# Patient Record
Sex: Male | Born: 1960 | Race: Black or African American | Hispanic: No | Marital: Married | Smoking: Former smoker
Health system: Southern US, Community
[De-identification: ages and names within clinical notes are randomized; demographics above are authoritative.]

## PROBLEM LIST (undated history)

## (undated) DIAGNOSIS — I10 Essential (primary) hypertension: Secondary | ICD-10-CM

## (undated) DIAGNOSIS — K219 Gastro-esophageal reflux disease without esophagitis: Secondary | ICD-10-CM

## (undated) DIAGNOSIS — E785 Hyperlipidemia, unspecified: Secondary | ICD-10-CM

## (undated) HISTORY — DX: Gastro-esophageal reflux disease without esophagitis: K21.9

## (undated) HISTORY — PX: OTHER SURGICAL HISTORY: SHX169

## (undated) HISTORY — DX: Hyperlipidemia, unspecified: E78.5

## (undated) HISTORY — PX: FOREIGN BODY REMOVAL: SHX962

---

## 2002-03-11 ENCOUNTER — Emergency Department (HOSPITAL_COMMUNITY): Admission: EM | Admit: 2002-03-11 | Discharge: 2002-03-11 | Payer: Self-pay | Admitting: Emergency Medicine

## 2002-03-22 ENCOUNTER — Encounter: Admission: RE | Admit: 2002-03-22 | Discharge: 2002-03-22 | Payer: Self-pay | Admitting: Internal Medicine

## 2002-04-12 ENCOUNTER — Encounter: Admission: RE | Admit: 2002-04-12 | Discharge: 2002-04-12 | Payer: Self-pay | Admitting: Internal Medicine

## 2002-04-15 ENCOUNTER — Encounter: Admission: RE | Admit: 2002-04-15 | Discharge: 2002-04-15 | Payer: Self-pay | Admitting: Internal Medicine

## 2002-04-18 ENCOUNTER — Encounter: Admission: RE | Admit: 2002-04-18 | Discharge: 2002-04-18 | Payer: Self-pay | Admitting: Internal Medicine

## 2002-09-02 ENCOUNTER — Encounter: Payer: Self-pay | Admitting: Emergency Medicine

## 2002-09-02 ENCOUNTER — Emergency Department (HOSPITAL_COMMUNITY): Admission: EM | Admit: 2002-09-02 | Discharge: 2002-09-02 | Payer: Self-pay | Admitting: Emergency Medicine

## 2002-10-23 ENCOUNTER — Emergency Department (HOSPITAL_COMMUNITY): Admission: EM | Admit: 2002-10-23 | Discharge: 2002-10-23 | Payer: Self-pay | Admitting: Emergency Medicine

## 2005-08-31 ENCOUNTER — Emergency Department (HOSPITAL_COMMUNITY): Admission: EM | Admit: 2005-08-31 | Discharge: 2005-08-31 | Payer: Self-pay | Admitting: Emergency Medicine

## 2005-09-01 ENCOUNTER — Emergency Department (HOSPITAL_COMMUNITY): Admission: EM | Admit: 2005-09-01 | Discharge: 2005-09-01 | Payer: Self-pay | Admitting: Emergency Medicine

## 2009-11-06 ENCOUNTER — Emergency Department (HOSPITAL_COMMUNITY): Admission: EM | Admit: 2009-11-06 | Discharge: 2009-11-07 | Payer: Self-pay | Admitting: Emergency Medicine

## 2010-05-15 ENCOUNTER — Emergency Department (HOSPITAL_COMMUNITY): Admission: EM | Admit: 2010-05-15 | Discharge: 2010-05-15 | Payer: Self-pay | Admitting: Emergency Medicine

## 2010-11-11 LAB — POCT I-STAT, CHEM 8
BUN: 10 mg/dL (ref 6–23)
Calcium, Ion: 1.1 mmol/L — ABNORMAL LOW (ref 1.12–1.32)
Chloride: 104 mEq/L (ref 96–112)
Creatinine, Ser: 0.9 mg/dL (ref 0.4–1.5)
Glucose, Bld: 97 mg/dL (ref 70–99)
HCT: 53 % — ABNORMAL HIGH (ref 39.0–52.0)
Hemoglobin: 18 g/dL — ABNORMAL HIGH (ref 13.0–17.0)
Potassium: 3.9 mEq/L (ref 3.5–5.1)
Sodium: 140 mEq/L (ref 135–145)
TCO2: 26 mmol/L (ref 0–100)

## 2010-11-21 LAB — POCT CARDIAC MARKERS
CKMB, poc: 8.6 ng/mL (ref 1.0–8.0)
Myoglobin, poc: 128 ng/mL (ref 12–200)
Troponin i, poc: <0.05 ng/mL (ref 0.00–0.09)

## 2010-11-21 LAB — POCT I-STAT, CHEM 8
BUN: 13 mg/dL (ref 6–23)
Calcium, Ion: 1.11 mmol/L — ABNORMAL LOW (ref 1.12–1.32)
Chloride: 103 meq/L (ref 96–112)
Creatinine, Ser: 1 mg/dL (ref 0.4–1.5)
Glucose, Bld: 81 mg/dL (ref 70–99)
HCT: 50 % (ref 39.0–52.0)
Hemoglobin: 17 g/dL (ref 13.0–17.0)
Potassium: 4 meq/L (ref 3.5–5.1)
Sodium: 138 mEq/L (ref 135–145)
TCO2: 28 mmol/L (ref 0–100)

## 2010-11-21 LAB — CK TOTAL AND CKMB (NOT AT ARMC)
CK, MB: 12.1 ng/mL (ref 0.3–4.0)
Relative Index: 1.7 (ref 0.0–2.5)
Total CK: 692 U/L — ABNORMAL HIGH (ref 7–232)

## 2010-11-21 LAB — TROPONIN I: Troponin I: 0.01 ng/mL (ref 0.00–0.06)

## 2012-09-13 ENCOUNTER — Encounter (HOSPITAL_COMMUNITY): Payer: Self-pay | Admitting: *Deleted

## 2012-09-13 ENCOUNTER — Emergency Department (HOSPITAL_COMMUNITY)
Admission: EM | Admit: 2012-09-13 | Discharge: 2012-09-13 | Disposition: A | Discharge status: Home / Self Care | Payer: BC Managed Care – PPO | Source: Home / Self Care | Attending: Emergency Medicine | Admitting: Emergency Medicine

## 2012-09-13 DIAGNOSIS — I10 Essential (primary) hypertension: Secondary | ICD-10-CM

## 2012-09-13 DIAGNOSIS — K2990 Gastroduodenitis, unspecified, without bleeding: Secondary | ICD-10-CM

## 2012-09-13 DIAGNOSIS — J40 Bronchitis, not specified as acute or chronic: Secondary | ICD-10-CM

## 2012-09-13 DIAGNOSIS — K2970 Gastritis, unspecified, without bleeding: Secondary | ICD-10-CM

## 2012-09-13 HISTORY — DX: Essential (primary) hypertension: I10

## 2012-09-13 MED ORDER — AMOXICILLIN 500 MG PO CAPS: 500.0000 mg | ORAL_CAPSULE | Freq: Three times a day (TID) | ORAL | Status: AC

## 2012-09-13 MED ORDER — ONDANSETRON HCL 4 MG PO TABS: 4.0000 mg | ORAL_TABLET | Freq: Three times a day (TID) | ORAL | Status: DC | PRN

## 2012-09-13 MED ORDER — OMEPRAZOLE 20 MG PO CPDR: 20.0000 mg | DELAYED_RELEASE_CAPSULE | Freq: Every day | ORAL | Status: DC

## 2012-09-13 MED ORDER — LISINOPRIL 10 MG PO TABS: 20.0000 mg | ORAL_TABLET | Freq: Every day | ORAL | Status: DC

## 2012-09-13 NOTE — ED Provider Notes (Signed)
History     CSN: [id number]  Arrival date & time 09/13/12  1708   First MD Initiated Contact with Patient 09/13/12 1711      Chief Complaint  Patient presents with  . Emesis    (Consider location/radiation/quality/duration/timing/severity/associated sxs/prior treatment) HPI Comments: Patient presents urgent care this evening described that he's been having a respiratory cold-like infection for about 2 weeks. He's been coughing and now is observing colored phlegm. He feels he still has congestion in his chest. Has not had a fever for about a week but doesn't remember that during the first week he had some tactile fevers at home. Having bodyaches, vomited once here as a result of a coughing spell. Is at some soreness on his chest from coughing.     "I have run out of my blood pressure medicine for many months" as I don't have primary care Dr. Patient denies any chest pains, dizziness, weakness or numbness of any upper or lower extremity, when asked.  Patient is a 52 y.o. male presenting with vomiting. The history is provided by the patient.  Emesis  This is a recurrent problem. The current episode started more than 1 week ago. The problem occurs 5 to 10 times per day. The problem has been gradually worsening. The emesis has an appearance of stomach contents. Associated symptoms include chills, cough and URI. Pertinent negatives include no arthralgias, no diarrhea, no fever, no headaches and no myalgias. Risk factors include ill contacts.    Past Medical History  Diagnosis Date  . Hypertension     off medication for 1 year    History reviewed. No pertinent past surgical history.  Family History  Problem Relation Age of Onset  . Stroke Mother   . Stroke Father   . Hypertension Father     History  Substance Use Topics  . Smoking status: Current Every Day Smoker -- 0.2 packs/day    Types: Cigarettes  . Smokeless tobacco: Not on file  . Alcohol Use: 8.4 oz/week    14 Cans of  beer per week      Review of Systems  Constitutional: Positive for chills, activity change and appetite change. Negative for fever and fatigue.  HENT: Positive for congestion, sore throat, rhinorrhea and sinus pressure. Negative for trouble swallowing, neck pain, neck stiffness and voice change.   Respiratory: Positive for cough. Negative for chest tightness, shortness of breath and wheezing.   Cardiovascular: Negative for chest pain, palpitations and leg swelling.  Gastrointestinal: Positive for vomiting. Negative for diarrhea.  Musculoskeletal: Negative for myalgias and arthralgias.  Skin: Negative for rash.  Neurological: Negative for dizziness, weakness, numbness and headaches.    Allergies  Review of patient's allergies indicates no known allergies.  Home Medications   Current Outpatient Rx  Name  Route  Sig  Dispense  Refill  . GUAIFENESIN 100 MG/5ML PO SYRP   Oral   Take 200 mg by mouth 3 (three) times daily as needed.         . IBUPROFEN 200 MG PO TABS   Oral   Take 600 mg by mouth every 6 (six) hours as needed.           BP 163/108  Pulse 84  Temp 98.9 F (37.2 C) (Oral)  Resp 18  SpO2 97%  Physical Exam  Nursing note and vitals reviewed. Constitutional: Vital signs are normal. He appears well-developed and well-nourished.  Non-toxic appearance. He does not have a sickly appearance. He does not appear  ill. No distress.  HENT:  Head: Normocephalic.  Eyes: Conjunctivae normal are normal. Right eye exhibits no discharge. Left eye exhibits no discharge. No scleral icterus.  Neck: Neck supple. No JVD present.  Cardiovascular: Normal rate.  Exam reveals no gallop and no friction rub.   No murmur heard. Pulmonary/Chest: Effort normal and breath sounds normal. No respiratory distress.  Abdominal: Normal appearance. There is no hepatosplenomegaly or hepatomegaly. There is tenderness in the epigastric area. There is no rigidity, no rebound, no guarding, no CVA  tenderness, no tenderness at McBurney's point and negative Murphy's sign.    Musculoskeletal: He exhibits no edema and no tenderness.  Lymphadenopathy:    He has no cervical adenopathy.  Neurological: He is alert.  Skin: No rash noted. No erythema.    ED Course  Procedures (including critical care time)  Labs Reviewed - No data to display No results found.   No diagnosis found.    MDM  Patient with symptoms and exam consistent with bronchitis has been expressing respiratory symptoms for at least 2 weeks. Is in no respiratory distress. Nauseous - and one episode of emesis after a coughing spell. Patient is also noted to be hypertensive ( no chest pains or neurological symptoms ).  Problem #1 respiratory symptoms symptoms and exam consistent with bronchitis patient has been symptomatic for 2 weeks based on lung exam and pulse oxygenation the ranges from 97-100% on room air will treat patient with doxycycline and short course of prednisone has been instructed to followup with no improvement is noted after 3-5 days.    problem #2 patient is possibly having gastritis and he suspect is related to combination of the cough syrup with Motrin. Will start patient on a PPI prescribe him some Zofran instructed patient that if any blood any further vomiting or with any bowel movement she should go to the emergency department. He understands discharge instructions and treatment plan  Problem #3 chronic hypertension untreated and uncontrolled. Will start patient on hydrochlorothiazide information was given to check her eligibility to be established in the adult care center.      Jimmie Molly, MD 09/13/12 [phone]

## 2012-09-13 NOTE — ED Notes (Addendum)
C/o cough and cold 09/01/12.  He got better and got sick again yesterday with flu symptoms, body aches, chills and does not know if he had a fever- no thermometer.  Vomited x 1 approx.100 cc dark pinkish liquid ( he states is the Robitussin) today on arrival to the room.  Cough never completely went away.  Sputum yellow.  He is a smoker. C/o nausea.

## 2012-09-30 ENCOUNTER — Encounter (HOSPITAL_COMMUNITY): Payer: Self-pay

## 2012-09-30 ENCOUNTER — Emergency Department (INDEPENDENT_AMBULATORY_CARE_PROVIDER_SITE_OTHER): Payer: BC Managed Care – PPO

## 2012-09-30 ENCOUNTER — Emergency Department (HOSPITAL_COMMUNITY)
Admission: EM | Admit: 2012-09-30 | Discharge: 2012-09-30 | Disposition: A | Discharge status: Home / Self Care | Payer: BC Managed Care – PPO | Source: Home / Self Care

## 2012-09-30 DIAGNOSIS — J40 Bronchitis, not specified as acute or chronic: Secondary | ICD-10-CM

## 2012-09-30 MED ORDER — PREDNISONE 10 MG PO TABS: ORAL_TABLET | ORAL | Status: DC

## 2012-09-30 MED ORDER — ALBUTEROL SULFATE HFA 108 (90 BASE) MCG/ACT IN AERS: 1.0000 | INHALATION_SPRAY | Freq: Four times a day (QID) | RESPIRATORY_TRACT | Status: DC | PRN

## 2012-09-30 NOTE — ED Notes (Signed)
C/o "lightheadedness" , some fever and cough. Was seen 09/13/2012 still not better.

## 2012-09-30 NOTE — ED Provider Notes (Signed)
History     CSN: [id number]  Arrival date & time 09/30/12  1344   None     Chief Complaint  Patient presents with  . Dizziness    (Consider location/radiation/quality/duration/timing/severity/associated sxs/prior treatment) Patient is a 52 y.o. male presenting with cough. The history is provided by the patient. No language interpreter was used.  Cough This is a new problem. The current episode started more than 1 week ago. The problem occurs constantly. The cough is productive of sputum. There has been no fever. The fever has been present for less than 1 day. Associated symptoms include rhinorrhea and shortness of breath. He has tried nothing for the symptoms. The treatment provided moderate relief. He is a smoker. His past medical history is significant for bronchitis. His past medical history does not include pneumonia.   Pt complains of a cough and congestion.  Pt was treated 2 weeks ago.  Symptoms continue Past Medical History  Diagnosis Date  . Hypertension     off medication for 1 year    History reviewed. No pertinent past surgical history.  Family History  Problem Relation Age of Onset  . Stroke Mother   . Stroke Father   . Hypertension Father     History  Substance Use Topics  . Smoking status: Current Every Day Smoker -- 0.2 packs/day    Types: Cigarettes  . Smokeless tobacco: Not on file  . Alcohol Use: 8.4 oz/week    14 Cans of beer per week      Review of Systems  HENT: Positive for rhinorrhea.   Respiratory: Positive for cough and shortness of breath.   All other systems reviewed and are negative.    Allergies  Review of patient's allergies indicates no known allergies.  Home Medications   Current Outpatient Rx  Name  Route  Sig  Dispense  Refill  . LISINOPRIL 10 MG PO TABS   Oral   Take 2 tablets (20 mg total) by mouth daily.   30 tablet   1   . ONDANSETRON HCL 4 MG PO TABS   Oral   Take 1 tablet (4 mg total) by mouth every 8 (eight)  hours as needed for nausea.   10 tablet   0   . OMEPRAZOLE 20 MG PO CPDR   Oral   Take 1 capsule (20 mg total) by mouth daily.   15 capsule   0     BP 180/129  Pulse 86  Temp 99.2 F (37.3 C) (Oral)  Resp 18  SpO2 100%  Physical Exam  Nursing note and vitals reviewed. Constitutional: He is oriented to person, place, and time. He appears well-developed and well-nourished.  HENT:  Head: Normocephalic.  Right Ear: External ear normal.  Left Ear: External ear normal.  Nose: Nose normal.  Mouth/Throat: Oropharynx is clear and moist.  Eyes: Conjunctivae normal and EOM are normal. Pupils are equal, round, and reactive to light.  Neck: Normal range of motion. Neck supple.  Cardiovascular: Normal rate.   Pulmonary/Chest: Effort normal and breath sounds normal.  Abdominal: Soft. Bowel sounds are normal.  Musculoskeletal: Normal range of motion.  Neurological: He is alert and oriented to person, place, and time. He has normal reflexes.  Skin: Skin is warm.  Psychiatric: He has a normal mood and affect.    ED Course  Procedures (including critical care time)  Labs Reviewed - No data to display No results found.   No diagnosis found.    MDM  No pneumonia.  I will treat with albuterol and prednisone.        Lonia Skinner [place], [place] 09/30/12 1704

## 2012-09-30 NOTE — ED Notes (Signed)
[id number]<AV> Expected date:10/01/12<BR> Expected time: 8:00 PM<BR> Means of arrival:<BR> Comments:<BR> Computer inoperative

## 2012-09-30 NOTE — ED Provider Notes (Signed)
Medical screening examination/treatment/procedure(s) were performed by non-physician practitioner and as supervising physician I was immediately available for consultation/collaboration.  Raynald Blend, MD 09/30/12 [phone]

## 2012-10-01 ENCOUNTER — Encounter (HOSPITAL_COMMUNITY): Payer: Self-pay

## 2012-10-01 ENCOUNTER — Emergency Department (INDEPENDENT_AMBULATORY_CARE_PROVIDER_SITE_OTHER)
Admission: EM | Admit: 2012-10-01 | Discharge: 2012-10-01 | Disposition: A | Discharge status: Home / Self Care | Payer: BC Managed Care – PPO | Source: Home / Self Care

## 2012-10-01 DIAGNOSIS — R0982 Postnasal drip: Secondary | ICD-10-CM

## 2012-10-01 DIAGNOSIS — J040 Acute laryngitis: Secondary | ICD-10-CM

## 2012-10-01 DIAGNOSIS — J069 Acute upper respiratory infection, unspecified: Secondary | ICD-10-CM

## 2012-10-01 MED ORDER — ALBUTEROL SULFATE HFA 108 (90 BASE) MCG/ACT IN AERS: 1.0000 | INHALATION_SPRAY | Freq: Four times a day (QID) | RESPIRATORY_TRACT | Status: DC | PRN

## 2012-10-01 MED ORDER — PREDNISONE 10 MG PO TABS: ORAL_TABLET | ORAL | Status: DC

## 2012-10-01 MED ORDER — PHENYLEPHRINE-CHLORPHEN-DM 10-4-12.5 MG/5ML PO LIQD: 5.0000 mL | ORAL | Status: DC | PRN

## 2012-10-01 NOTE — ED Provider Notes (Signed)
Medical screening examination/treatment/procedure(s) were performed by non-physician practitioner and as supervising physician I was immediately available for consultation/collaboration.  Artie Takayama, M.D.   Jaxon Mynhier C Laquenta Whitsell, MD 10/01/12 2230 

## 2012-10-01 NOTE — ED Provider Notes (Signed)
History     CSN: [id number]  Arrival date & time 10/01/12  1012   First MD Initiated Contact with Patient 10/01/12 1015      Chief Complaint  Patient presents with  . Cough    (Consider location/radiation/quality/duration/timing/severity/associated sxs/prior treatment) HPI Comments: 52 year old male who presents with soreness in the throat and laryngitis particularly in the morning upon awakening. This started several days ago. He states he still has some chills and sweating at night. This morning when he woke he had  mild dizziness which has resolved. He was seen yesterday for similar symptoms with a diagnosis of bronchitis and treated with prednisone and albuterol inhaler. He has started taking those. He states that his bronchitis feels like it is getting much better. His greatest concern is the laryngitis and soreness of the throat. He is not taking any medications for these symptoms. He states that the provider told him yesterday she went to give him something for his throat he states that she may have forgotten.   Past Medical History  Diagnosis Date  . Hypertension     off medication for 1 year    History reviewed. No pertinent past surgical history.  Family History  Problem Relation Age of Onset  . Stroke Mother   . Stroke Father   . Hypertension Father     History  Substance Use Topics  . Smoking status: Current Every Day Smoker -- 0.2 packs/day    Types: Cigarettes  . Smokeless tobacco: Not on file  . Alcohol Use: 8.4 oz/week    14 Cans of beer per week      Review of Systems  Constitutional: Positive for fever. Negative for diaphoresis, activity change and fatigue.  HENT: Positive for sore throat and postnasal drip. Negative for ear pain, facial swelling, rhinorrhea, trouble swallowing, neck pain and neck stiffness.   Eyes: Negative for pain, discharge and redness.  Respiratory: Positive for cough. Negative for chest tightness and shortness of breath.    Cardiovascular: Negative.   Gastrointestinal: Negative.   Musculoskeletal: Negative.   Neurological: Negative.     Allergies  Review of patient's allergies indicates no known allergies.  Home Medications   Current Outpatient Rx  Name  Route  Sig  Dispense  Refill  . ALBUTEROL SULFATE HFA 108 (90 BASE) MCG/ACT IN AERS   Inhalation   Inhale 1-2 puffs into the lungs every 6 (six) hours as needed for wheezing.   1 Inhaler   0   . LISINOPRIL 10 MG PO TABS   Oral   Take 2 tablets (20 mg total) by mouth daily.   30 tablet   1   . OMEPRAZOLE 20 MG PO CPDR   Oral   Take 1 capsule (20 mg total) by mouth daily.   15 capsule   0   . ONDANSETRON HCL 4 MG PO TABS   Oral   Take 1 tablet (4 mg total) by mouth every 8 (eight) hours as needed for nausea.   10 tablet   0   . PHENYLEPHRINE-CHLORPHEN-DM 06-02-11.5 MG/5ML PO LIQD   Oral   Take 5 mLs by mouth every 4 (four) hours as needed.   120 mL   0   . PREDNISONE 10 MG PO TABS      6,5,4,3,2,1 taper   21 tablet   0     BP 195/116  Pulse 88  Temp 98.7 F (37.1 C) (Oral)  Resp 20  SpO2 97%  Physical Exam  Nursing  note and vitals reviewed. Constitutional: He is oriented to person, place, and time. He appears well-developed and well-nourished. No distress.  HENT:       Bilateral TMs are normal Oropharynx is erythematous with a copious amount of clear to slightly gray PND. No exudates. Airway is widely patent.  Eyes: Conjunctivae normal and EOM are normal.  Neck: Normal range of motion. Neck supple.  Cardiovascular: Normal rate and regular rhythm.   Pulmonary/Chest: Effort normal and breath sounds normal. No respiratory distress. He has no wheezes. He has no rales.  Musculoskeletal: Normal range of motion. He exhibits no edema.  Lymphadenopathy:    He has no cervical adenopathy.  Neurological: He is alert and oriented to person, place, and time.  Skin: Skin is warm and dry. No rash noted.  Psychiatric: He has a  normal mood and affect.    ED Course  Procedures (including critical care time)  Labs Reviewed - No data to display Dg Chest 2 View  09/30/2012  *RADIOLOGY REPORT*  Clinical Data: Cough, recent diagnosis of bronchitis, active smoker, low grade fever  CHEST - 2 VIEW  Comparison: 10/27/2009  Findings:  Grossly unchanged cardiac silhouette and mediastinal contours with mild tortuosity of the thoracic aorta.  The lungs are mildly hyperexpanded with flattening of bilateral hemidiaphragms.  There is mild diffuse thickening of the pulmonary interstitium.  No focal airspace opacities.  No pleural effusion or pneumothorax. Unchanged bones.  IMPRESSION:  Hyperexpanded lungs and mild bronchitic change without focal airspace opacity to suggest pneumonia.   Original Report Authenticated By: Tacey Ruiz, MD      1. URI (upper respiratory infection)   2. PND (post-nasal drip)   3. Laryngitis       MDM  The patient is discharged in stable condition. His primary symptom of voice changes and soreness in the throat is caused by a copious amount of PND. The visit from yesterday including the chest x-ray and the visit approximately 2 weeks ago was reviewed. Norell CS 1 teaspoon every 4-6 hours when necessary cough and drainage. Plenty of fluids stay well hydrated Followup with your new PCP later this month. For any worsening new symptoms or problems may return.        Hayden Rasmussen, NP 10/01/12 1114

## 2012-10-01 NOTE — ED Notes (Signed)
Seen 1-16 and 2-2 for his symptoms; concerned because he did not receive an antibiotic Rx yesterday

## 2012-10-25 ENCOUNTER — Ambulatory Visit (INDEPENDENT_AMBULATORY_CARE_PROVIDER_SITE_OTHER): Payer: BC Managed Care – PPO | Admitting: Internal Medicine

## 2012-10-25 ENCOUNTER — Encounter: Payer: Self-pay | Admitting: Internal Medicine

## 2012-10-25 ENCOUNTER — Other Ambulatory Visit (INDEPENDENT_AMBULATORY_CARE_PROVIDER_SITE_OTHER): Payer: BC Managed Care – PPO

## 2012-10-25 VITALS — BP 140/90 | HR 79 | Temp 98.0°F | Resp 16 | Ht 68.0 in | Wt 179.0 lb

## 2012-10-25 DIAGNOSIS — Z0000 Encounter for general adult medical examination without abnormal findings: Secondary | ICD-10-CM

## 2012-10-25 DIAGNOSIS — N529 Male erectile dysfunction, unspecified: Secondary | ICD-10-CM | POA: Insufficient documentation

## 2012-10-25 DIAGNOSIS — R9431 Abnormal electrocardiogram [ECG] [EKG]: Secondary | ICD-10-CM | POA: Insufficient documentation

## 2012-10-25 DIAGNOSIS — I10 Essential (primary) hypertension: Secondary | ICD-10-CM

## 2012-10-25 DIAGNOSIS — Z23 Encounter for immunization: Secondary | ICD-10-CM | POA: Insufficient documentation

## 2012-10-25 LAB — URINALYSIS, ROUTINE W REFLEX MICROSCOPIC
Bilirubin Urine: NEGATIVE
Hgb urine dipstick: NEGATIVE
Ketones, ur: NEGATIVE
Leukocytes, UA: NEGATIVE
Nitrite: NEGATIVE

## 2012-10-25 LAB — CBC WITH DIFFERENTIAL/PLATELET
Basophils Absolute: 0 10*3/uL (ref 0.0–0.1)
Eosinophils Absolute: 0.2 10*3/uL (ref 0.0–0.7)
HCT: 46.5 % (ref 39.0–52.0)
Hemoglobin: 15.7 g/dL (ref 13.0–17.0)
Lymphs Abs: 2.3 10*3/uL (ref 0.7–4.0)
MCHC: 33.8 g/dL (ref 30.0–36.0)
Monocytes Absolute: 0.7 10*3/uL (ref 0.1–1.0)
Monocytes Relative: 11 % (ref 3.0–12.0)
Neutro Abs: 3 10*3/uL (ref 1.4–7.7)
Platelets: 216 10*3/uL (ref 150.0–400.0)
RDW: 13.9 % (ref 11.5–14.6)

## 2012-10-25 LAB — COMPREHENSIVE METABOLIC PANEL
ALT: 36 U/L (ref 0–53)
AST: 37 U/L (ref 0–37)
CO2: 27 mEq/L (ref 19–32)
Chloride: 102 mEq/L (ref 96–112)
Creatinine, Ser: 0.9 mg/dL (ref 0.4–1.5)
GFR: 120.86 mL/min (ref >60.00)
Sodium: 138 mEq/L (ref 135–145)
Total Bilirubin: 0.6 mg/dL (ref 0.3–1.2)
Total Protein: 6.9 g/dL (ref 6.0–8.3)

## 2012-10-25 LAB — LIPID PANEL
Total CHOL/HDL Ratio: 5
Triglycerides: 78 mg/dL (ref 0.0–149.0)
VLDL: 15.6 mg/dL (ref 0.0–40.0)

## 2012-10-25 LAB — TSH: TSH: 2.41 u[IU]/mL (ref 0.35–5.50)

## 2012-10-25 LAB — LDL CHOLESTEROL, DIRECT: Direct LDL: 185.3 mg/dL

## 2012-10-25 MED ORDER — VARDENAFIL HCL 20 MG PO TABS: 20.0000 mg | ORAL_TABLET | Freq: Every day | ORAL | Status: DC | PRN

## 2012-10-25 MED ORDER — NEBIVOLOL HCL 5 MG PO TABS: 5.0000 mg | ORAL_TABLET | Freq: Every day | ORAL | Status: DC

## 2012-10-25 NOTE — Assessment & Plan Note (Addendum)
He had some cough and SOB about 6 weeks ago but that has resolved. His EKG shows loss of voltage and large q waves in V1 and V2 suggestive of recent anterior infarct but I do not hear any symptoms to suggest that today. I have asked him to see cardiology for further evaluation.

## 2012-10-25 NOTE — Assessment & Plan Note (Signed)
Stop the ACEI due to side effects Start bystolic I will check his labs today to look for secondary causes and end organ damage

## 2012-10-25 NOTE — Assessment & Plan Note (Signed)
Exam done  Vaccines were updated Labs ordered Pt ed material was given He was referred for a colonoscopy

## 2012-10-25 NOTE — Assessment & Plan Note (Signed)
He will try levitra

## 2012-10-25 NOTE — Patient Instructions (Signed)
Hypertension As your heart beats, it forces blood through your arteries. This force is your blood pressure. If the pressure is too high, it is called hypertension (HTN) or high blood pressure. HTN is dangerous because you may have it and not know it. High blood pressure may mean that your heart has to work harder to pump blood. Your arteries may be narrow or stiff. The extra work puts you at risk for heart disease, stroke, and other problems.  Blood pressure consists of two numbers, a higher number over a lower, 110/72, for example. It is stated as "110 over 72." The ideal is below 120 for the top number (systolic) and under 80 for the bottom (diastolic). Write down your blood pressure today. You should pay close attention to your blood pressure if you have certain conditions such as:  Heart failure.  Prior heart attack.  Diabetes  Chronic kidney disease.  Prior stroke.  Multiple risk factors for heart disease. To see if you have HTN, your blood pressure should be measured while you are seated with your arm held at the level of the heart. It should be measured at least twice. A one-time elevated blood pressure reading (especially in the Emergency Department) does not mean that you need treatment. There may be conditions in which the blood pressure is different between your right and left arms. It is important to see your caregiver soon for a recheck. Most people have essential hypertension which means that there is not a specific cause. This type of high blood pressure may be lowered by changing lifestyle factors such as:  Stress.  Smoking.  Lack of exercise.  Excessive weight.  Drug/tobacco/alcohol use.  Eating less salt. Most people do not have symptoms from high blood pressure until it has caused damage to the body. Effective treatment can often prevent, delay or reduce that damage. TREATMENT  When a cause has been identified, treatment for high blood pressure is directed at the  cause. There are a large number of medications to treat HTN. These fall into several categories, and your caregiver will help you select the medicines that are best for you. Medications may have side effects. You should review side effects with your caregiver. If your blood pressure stays high after you have made lifestyle changes or started on medicines,   Your medication(s) may need to be changed.  Other problems may need to be addressed.  Be certain you understand your prescriptions, and know how and when to take your medicine.  Be sure to follow up with your caregiver within the time frame advised (usually within two weeks) to have your blood pressure rechecked and to review your medications.  If you are taking more than one medicine to lower your blood pressure, make sure you know how and at what times they should be taken. Taking two medicines at the same time can result in blood pressure that is too low. SEEK IMMEDIATE MEDICAL CARE IF:  You develop a severe headache, blurred or changing vision, or confusion.  You have unusual weakness or numbness, or a faint feeling.  You have severe chest or abdominal pain, vomiting, or breathing problems. MAKE SURE YOU:   Understand these instructions.  Will watch your condition.  Will get help right away if you are not doing well or get worse. Document Released: 08/15/2005 Document Revised: 11/07/2011 Document Reviewed: 04/04/2008 Exit[employer] Patient Information 2013 [employer]. Health Maintenance, Males A healthy lifestyle and preventative care can promote health and wellness.  Maintain   regular health, dental, and eye exams.  Eat a healthy diet. Foods like vegetables, fruits, whole grains, low-fat dairy products, and lean protein foods contain the nutrients you need without too many calories. Decrease your intake of foods high in solid fats, added sugars, and salt. Get information about a proper diet from your caregiver, if  necessary.  Regular physical exercise is one of the most important things you can do for your health. Most adults should get at least 150 minutes of moderate-intensity exercise (any activity that increases your heart rate and causes you to sweat) each week. In addition, most adults need muscle-strengthening exercises on 2 or more days a week.   Maintain a healthy weight. The body mass index (BMI) is a screening tool to identify possible weight problems. It provides an estimate of body fat based on height and weight. Your caregiver can help determine your BMI, and can help you achieve or maintain a healthy weight. For adults 20 years and older:  A BMI below 18.5 is considered underweight.  A BMI of 18.5 to 24.9 is normal.  A BMI of 25 to 29.9 is considered overweight.  A BMI of 30 and above is considered obese.  Maintain normal blood lipids and cholesterol by exercising and minimizing your intake of saturated fat. Eat a balanced diet with plenty of fruits and vegetables. Blood tests for lipids and cholesterol should begin at age 20 and be repeated every 5 years. If your lipid or cholesterol levels are high, you are over 50, or you are a high risk for heart disease, you may need your cholesterol levels checked more frequently.Ongoing high lipid and cholesterol levels should be treated with medicines, if diet and exercise are not effective.  If you smoke, find out from your caregiver how to quit. If you do not use tobacco, do not start.  If you choose to drink alcohol, do not exceed 2 drinks per day. One drink is considered to be 12 ounces (355 mL) of beer, 5 ounces (148 mL) of wine, or 1.5 ounces (44 mL) of liquor.  Avoid use of street drugs. Do not share needles with anyone. Ask for help if you need support or instructions about stopping the use of drugs.  High blood pressure causes heart disease and increases the risk of stroke. Blood pressure should be checked at least every 1 to 2 years.  Ongoing high blood pressure should be treated with medicines if weight loss and exercise are not effective.  If you are 45 to 51 years old, ask your caregiver if you should take aspirin to prevent heart disease.  Diabetes screening involves taking a blood sample to check your fasting blood sugar level. This should be done once every 3 years, after age 45, if you are within normal weight and without risk factors for diabetes. Testing should be considered at a younger age or be carried out more frequently if you are overweight and have at least 1 risk factor for diabetes.  Colorectal cancer can be detected and often prevented. Most routine colorectal cancer screening begins at the age of 50 and continues through age 75. However, your caregiver may recommend screening at an earlier age if you have risk factors for colon cancer. On a yearly basis, your caregiver may provide home test kits to check for hidden blood in the stool. Use of a small camera at the end of a tube, to directly examine the colon (sigmoidoscopy or colonoscopy), can detect the earliest forms of colorectal   cancer. Talk to your caregiver about this at age 50, when routine screening begins. Direct examination of the colon should be repeated every 5 to 10 years through age 75, unless early forms of pre-cancerous polyps or small growths are found.  Hepatitis C blood testing is recommended for all people born from 1945 through 1965 and any individual with known risks for hepatitis C.  Healthy men should no longer receive prostate-specific antigen (PSA) blood tests as part of routine cancer screening. Consult with your caregiver about prostate cancer screening.  Testicular cancer screening is not recommended for adolescents or adult males who have no symptoms. Screening includes self-exam, caregiver exam, and other screening tests. Consult with your caregiver about any symptoms you have or any concerns you have about testicular  cancer.  Practice safe sex. Use condoms and avoid high-risk sexual practices to reduce the spread of sexually transmitted infections (STIs).  Use sunscreen with a sun protection factor (SPF) of 30 or greater. Apply sunscreen liberally and repeatedly throughout the day. You should seek shade when your shadow is shorter than you. Protect yourself by wearing long sleeves, pants, a wide-brimmed hat, and sunglasses year round, whenever you are outdoors.  Notify your caregiver of new moles or changes in moles, especially if there is a change in shape or color. Also notify your caregiver if a mole is larger than the size of a pencil eraser.  A one-time screening for abdominal aortic aneurysm (AAA) and surgical repair of large AAAs by sound wave imaging (ultrasonography) is recommended for ages 65 to 75 years who are current or former smokers.  Stay current with your immunizations. Document Released: 02/11/2008 Document Revised: 11/07/2011 Document Reviewed: 01/10/2011 [employer] Patient Information 2013 [employer].  

## 2012-10-25 NOTE — Progress Notes (Signed)
Subjective:    Patient ID: Tristan Parker, male    DOB: 10-13-1960, 52 y.o.   MRN: [id number]  Hypertension This is a chronic problem. The current episode started more than 1 year ago. The problem is unchanged. The problem is uncontrolled. Pertinent negatives include no anxiety, blurred vision, chest pain, headaches, malaise/fatigue, neck pain, orthopnea, palpitations, peripheral edema, PND, shortness of breath or sweats. Past treatments include ACE inhibitors. Compliance problems include medication side effects (resp complaints).   Erectile Dysfunction This is a chronic problem. The current episode started more than 1 year ago. The problem is unchanged. The nature of his difficulty is achieving erection, maintaining erection and penetration. He reports no anxiety, decreased libido or performance anxiety. Irritative symptoms do not include frequency, nocturia or urgency. Obstructive symptoms do not include dribbling, incomplete emptying, an intermittent stream, a slower stream, straining or a weak stream. Pertinent negatives include no chills, dysuria, genital pain, hematuria or inability to urinate. The symptoms are aggravated by medications. Past treatments include nothing. Risk factors include hypertension.      Review of Systems  Constitutional: Negative for fever, chills, malaise/fatigue, diaphoresis, activity change, appetite change, fatigue and unexpected weight change.  HENT: Positive for sore throat (chronic tickle in his throat) and postnasal drip. Negative for congestion, facial swelling, rhinorrhea, sneezing, trouble swallowing, neck pain, voice change and sinus pressure.   Eyes: Negative.  Negative for blurred vision.  Respiratory: Negative for apnea, cough, choking, chest tightness, shortness of breath, wheezing and stridor.   Cardiovascular: Negative for chest pain, palpitations, orthopnea, leg swelling and PND.  Gastrointestinal: Negative for nausea, abdominal pain, diarrhea,  constipation, blood in stool, abdominal distention, anal bleeding and rectal pain.  Endocrine: Negative.   Genitourinary: Negative.  Negative for dysuria, urgency, frequency, hematuria, decreased libido, incomplete emptying and nocturia.  Musculoskeletal: Negative.   Skin: Negative.   Allergic/Immunologic: Negative.   Neurological: Negative for dizziness, tremors, weakness, light-headedness and headaches.  Hematological: Negative for adenopathy. Does not bruise/bleed easily.  Psychiatric/Behavioral: Negative.        Objective:   Physical Exam  Vitals reviewed. Constitutional: He is oriented to person, place, and time. He appears well-developed and well-nourished. No distress.  HENT:  Head: Normocephalic and atraumatic.  Mouth/Throat: Oropharynx is clear and moist. No oropharyngeal exudate.  Eyes: Conjunctivae are normal. Right eye exhibits no discharge. Left eye exhibits no discharge. No scleral icterus.  Neck: Normal range of motion. Neck supple. No JVD present. No tracheal deviation present. No thyromegaly present.  Cardiovascular: Normal rate, regular rhythm, normal heart sounds and intact distal pulses.  Exam reveals no gallop and no friction rub.   No murmur heard. Pulmonary/Chest: Effort normal and breath sounds normal. No stridor. No respiratory distress. He has no wheezes. He has no rales. He exhibits no tenderness.  Abdominal: Soft. Bowel sounds are normal. He exhibits no distension and no mass. There is no tenderness. There is no rebound and no guarding. Hernia confirmed negative in the right inguinal area and confirmed negative in the left inguinal area.  Genitourinary: Rectum normal, prostate normal and penis normal. Rectal exam shows no external hemorrhoid, no internal hemorrhoid, no fissure, no mass, no tenderness and anal tone normal. Guaiac negative stool. Prostate is not enlarged and not tender. Right testis shows no mass, no swelling and no tenderness. Right testis is  descended. Left testis shows no mass, no swelling and no tenderness. Left testis is descended. Uncircumcised. No phimosis, paraphimosis, hypospadias, penile erythema or penile tenderness. No discharge  found.  Musculoskeletal: Normal range of motion. He exhibits no edema and no tenderness.  Lymphadenopathy:    He has no cervical adenopathy.       Right: No inguinal adenopathy present.       Left: No inguinal adenopathy present.  Neurological: He is oriented to person, place, and time.  Skin: Skin is warm and dry. No rash noted. He is not diaphoretic. No erythema. No pallor.  Psychiatric: He has a normal mood and affect. His behavior is normal. Judgment and thought content normal.      Lab Results  Component Value Date   HGB 18.0* 05/15/2010   HCT 53.0* 05/15/2010   GLUCOSE 97 05/15/2010   NA 140 05/15/2010   K 3.9 05/15/2010   CL 104 05/15/2010   CREATININE 0.9 05/15/2010   BUN 10 05/15/2010      Assessment & Plan:

## 2012-11-09 ENCOUNTER — Encounter: Payer: Self-pay | Admitting: Internal Medicine

## 2012-11-09 ENCOUNTER — Ambulatory Visit (INDEPENDENT_AMBULATORY_CARE_PROVIDER_SITE_OTHER): Payer: BC Managed Care – PPO | Admitting: Internal Medicine

## 2012-11-09 VITALS — BP 128/88 | HR 79 | Temp 98.7°F | Resp 16 | Wt 181.8 lb

## 2012-11-09 DIAGNOSIS — N529 Male erectile dysfunction, unspecified: Secondary | ICD-10-CM

## 2012-11-09 DIAGNOSIS — I10 Essential (primary) hypertension: Secondary | ICD-10-CM

## 2012-11-09 DIAGNOSIS — E785 Hyperlipidemia, unspecified: Secondary | ICD-10-CM

## 2012-11-09 MED ORDER — VARDENAFIL HCL 20 MG PO TABS: 20.0000 mg | ORAL_TABLET | Freq: Every day | ORAL | Status: DC | PRN

## 2012-11-09 MED ORDER — EZETIMIBE-SIMVASTATIN 10-20 MG PO TABS: 1.0000 | ORAL_TABLET | Freq: Every day | ORAL | Status: DC

## 2012-11-09 MED ORDER — NEBIVOLOL HCL 5 MG PO TABS: 5.0000 mg | ORAL_TABLET | Freq: Every day | ORAL | Status: DC

## 2012-11-09 NOTE — Patient Instructions (Signed)

## 2012-11-09 NOTE — Progress Notes (Signed)
  Subjective:    Patient ID: Tristan Parker, male    DOB: 1961-04-30, 52 y.o.   MRN: [id number]  Hypertension This is a chronic problem. The current episode started more than 1 month ago. The problem has been gradually improving since onset. The problem is controlled. Pertinent negatives include no anxiety, blurred vision, chest pain, headaches, malaise/fatigue, neck pain, orthopnea, palpitations, peripheral edema, PND, shortness of breath or sweats. Risk factors for coronary artery disease include smoking/tobacco exposure and stress. Past treatments include beta blockers. The current treatment provides significant improvement. There are no compliance problems.       Review of Systems  Constitutional: Negative.  Negative for malaise/fatigue.  HENT: Negative.  Negative for neck pain.   Eyes: Negative.  Negative for blurred vision.  Respiratory: Negative.  Negative for shortness of breath.   Cardiovascular: Negative.  Negative for chest pain, palpitations, orthopnea and PND.  Gastrointestinal: Negative.   Endocrine: Negative.   Genitourinary: Negative.   Musculoskeletal: Negative.   Skin: Negative.   Allergic/Immunologic: Negative.   Neurological: Negative.  Negative for headaches.  Hematological: Negative.   Psychiatric/Behavioral: Negative.        Objective:   Physical Exam  Vitals reviewed. Constitutional: He is oriented to person, place, and time. He appears well-developed and well-nourished. No distress.  HENT:  Head: Normocephalic and atraumatic.  Mouth/Throat: Oropharynx is clear and moist. No oropharyngeal exudate.  Eyes: Conjunctivae are normal. Right eye exhibits no discharge. Left eye exhibits no discharge. No scleral icterus.  Neck: Normal range of motion. Neck supple. No JVD present. No tracheal deviation present. No thyromegaly present.  Cardiovascular: Normal rate, regular rhythm, normal heart sounds and intact distal pulses.  Exam reveals no gallop and no friction  rub.   No murmur heard. Pulmonary/Chest: Effort normal and breath sounds normal. No stridor. No respiratory distress. He has no wheezes. He has no rales. He exhibits no tenderness.  Abdominal: Soft. Bowel sounds are normal. He exhibits no distension and no mass. There is no tenderness. There is no rebound and no guarding.  Musculoskeletal: Normal range of motion. He exhibits no edema and no tenderness.  Lymphadenopathy:    He has no cervical adenopathy.  Neurological: He is oriented to person, place, and time.  Skin: Skin is warm and dry. No rash noted. He is not diaphoretic. No erythema. No pallor.  Psychiatric: He has a normal mood and affect. His behavior is normal. Judgment and thought content normal.     Lab Results  Component Value Date   WBC 6.2 10/25/2012   HGB 15.7 10/25/2012   HCT 46.5 10/25/2012   PLT 216.0 10/25/2012   GLUCOSE 83 10/25/2012   CHOL 250* 10/25/2012   TRIG 78.0 10/25/2012   HDL 54.50 10/25/2012   LDLDIRECT 185.3 10/25/2012   ALT 36 10/25/2012   AST 37 10/25/2012   NA 138 10/25/2012   K 4.0 10/25/2012   CL 102 10/25/2012   CREATININE 0.9 10/25/2012   BUN 11 10/25/2012   CO2 27 10/25/2012   TSH 2.41 10/25/2012   PSA 1.28 10/25/2012       Assessment & Plan:

## 2012-11-10 NOTE — Assessment & Plan Note (Signed)
He has several risk factors for CAD and CVA so I have asked him to start vytorin for risk reduction

## 2012-11-10 NOTE — Assessment & Plan Note (Signed)
His BP is well controlled 

## 2012-11-20 ENCOUNTER — Institutional Professional Consult (permissible substitution): Payer: BC Managed Care – PPO | Admitting: Cardiology

## 2013-01-03 ENCOUNTER — Encounter: Payer: Self-pay | Admitting: Cardiology

## 2013-01-03 ENCOUNTER — Ambulatory Visit (INDEPENDENT_AMBULATORY_CARE_PROVIDER_SITE_OTHER): Payer: BC Managed Care – PPO | Admitting: Cardiology

## 2013-01-03 VITALS — BP 152/112 | HR 76 | Ht 67.0 in | Wt 182.8 lb

## 2013-01-03 DIAGNOSIS — I10 Essential (primary) hypertension: Secondary | ICD-10-CM

## 2013-01-03 DIAGNOSIS — Z79899 Other long term (current) drug therapy: Secondary | ICD-10-CM

## 2013-01-03 DIAGNOSIS — E7800 Pure hypercholesterolemia, unspecified: Secondary | ICD-10-CM

## 2013-01-03 MED ORDER — HYDROCHLOROTHIAZIDE 12.5 MG PO TABS: 12.5000 mg | ORAL_TABLET | Freq: Every day | ORAL | Status: DC

## 2013-01-03 MED ORDER — NEBIVOLOL HCL 10 MG PO TABS: 10.0000 mg | ORAL_TABLET | Freq: Every day | ORAL | Status: DC

## 2013-01-03 NOTE — Progress Notes (Signed)
HPI The patient presents for evaluation of difficult to control hypertension and cardiovascular risk factors. He has had no prior cardiac history. However, he does have an abnormal EKG probably related to LVH with repolarization. His started blood pressure medications after having been off of them for a while. He has recently been started on lipid-lowering drugs. He just quit smoking and is reducing his alcohol as well. He is trying to live a healthier lifestyle. However, after initially having reasonable blood pressure control it has been elevated at recent followup. He does not take it at home. The patient denies any symptoms such as chest discomfort, neck or arm discomfort. There has been no new shortness of breath, PND or orthopnea. There have been no reported palpitations, presyncope or syncope.  Allergies  Allergen Reactions  . Lisinopril Cough    Current Outpatient Prescriptions  Medication Sig Dispense Refill  . ezetimibe-simvastatin (VYTORIN) 10-20 MG per tablet Take 1 tablet by mouth at bedtime.  84 tablet  0  . nebivolol (BYSTOLIC) 5 MG tablet Take 1 tablet (5 mg total) by mouth daily.  90 tablet  3  . vardenafil (LEVITRA) 20 MG tablet Take 1 tablet (20 mg total) by mouth daily as needed for erectile dysfunction.  8 tablet  11  . hydrochlorothiazide (HYDRODIURIL) 12.5 MG tablet Take 1 tablet (12.5 mg total) by mouth daily.  30 tablet  11   No current facility-administered medications for this visit.    Past Medical History  Diagnosis Date  . Hypertension   . GERD (gastroesophageal reflux disease)   . Hyperlipemia     Past Surgical History  Procedure Laterality Date  . None      Family History  Problem Relation Age of Onset  . Stroke Mother   . Stroke Father   . Hypertension Father   . Cancer Neg Hx   . Alcohol abuse Neg Hx   . Asthma Neg Hx   . COPD Neg Hx   . Depression Neg Hx   . Diabetes Neg Hx   . Drug abuse Neg Hx   . Early death Neg Hx   . Hearing loss  Neg Hx   . Heart disease Neg Hx   . Hyperlipidemia Neg Hx   . Kidney disease Neg Hx     History   Social History  . Marital Status: Married    Spouse Name: N/A    Number of Children: 4  . Years of Education: N/A   Occupational History  . Chef    Social History Main Topics  . Smoking status: Former Smoker -- 0.25 packs/day for 25 years    Types: Cigarettes  . Smokeless tobacco: Never Used  . Alcohol Use: 8.4 oz/week    14 Cans of beer per week  . Drug Use: No  . Sexually Active: Yes -- Male partner(s)   Other Topics Concern  . Not on file   Social History Narrative   Lives at home with wife and two children.     ROS:  Positive for heartburn. Otherwise as stated in the HPI and negative for all other systems.  PHYSICAL EXAM BP 152/112  Pulse 76  Ht 5\' 7"  (1.702 m)  Wt 182 lb 12.8 oz (82.918 kg)  BMI 28.62 kg/m2 GENERAL:  Well appearing HEENT:  Pupils equal round and reactive, fundi not visualized, oral mucosa unremarkable NECK:  No jugular venous distention, waveform within normal limits, carotid upstroke brisk and symmetric, no bruits, no thyromegaly LYMPHATICS:  No cervical, inguinal adenopathy LUNGS:  Clear to auscultation bilaterally BACK:  No CVA tenderness CHEST:  Unremarkable HEART:  PMI not displaced or sustained,S1 and S2 within normal limits, no S3, no S4, no clicks, no rubs, no murmurs ABD:  Flat, positive bowel sounds normal in frequency in pitch, no bruits, no rebound, no guarding, no midline pulsatile mass, no hepatomegaly, no splenomegaly EXT:  2 plus pulses throughout, no edema, no cyanosis no clubbing SKIN:  No rashes no nodules NEURO:  Cranial nerves II through XII grossly intact, motor grossly intact throughout PSYCH:  Cognitively intact, oriented to person place and time   EKG:  Sinus rhythm, rate 75, axis within normal limits, lethargy hypertrophy by voltage criteria, anterior T-wave changes consistent with repolarization changes.  10/25/12  ASSESSMENT AND PLAN  HTN:  We discussed keeping a blood pressure diary. I will add HCTZ 12.5 mg to his regimen. I will increase his Bystolic to 10 mg daily.  In 2 weeks I will be checking a basic metabolic profile.  ABNORMAL EKG:  I will consider stress testing in the future once his blood pressure is better controlled.  DYSLIPIDEMIA:  He will come back for a fasting lipid profile in 2 weeks now that he has been taking the Vytorin.

## 2013-01-03 NOTE — Patient Instructions (Addendum)
Please start Hydrochlorothiazide 12.5 mg a day. Increase Bystolic to 10 mg a day. Continue all other medications as listed.  Please return fasting in 2 weeks for blood work (lipid, hepatic and BMP)  Please have a nurse room visit in 2 weeks for a blood pressure check.  Follow up with Dr Antoine Poche in about 6 weeks.

## 2013-01-17 ENCOUNTER — Ambulatory Visit: Payer: BC Managed Care – PPO | Admitting: *Deleted

## 2013-01-17 ENCOUNTER — Other Ambulatory Visit (INDEPENDENT_AMBULATORY_CARE_PROVIDER_SITE_OTHER): Payer: BC Managed Care – PPO

## 2013-01-17 VITALS — BP 154/106 | HR 76 | Wt 182.1 lb

## 2013-01-17 DIAGNOSIS — E7800 Pure hypercholesterolemia, unspecified: Secondary | ICD-10-CM

## 2013-01-17 DIAGNOSIS — Z79899 Other long term (current) drug therapy: Secondary | ICD-10-CM

## 2013-01-17 LAB — HEPATIC FUNCTION PANEL
ALT: 57 U/L — ABNORMAL HIGH (ref 0–53)
AST: 37 U/L (ref 0–37)
Bilirubin, Direct: 0 mg/dL (ref 0.0–0.3)
Total Bilirubin: 0.3 mg/dL (ref 0.3–1.2)

## 2013-01-17 LAB — BASIC METABOLIC PANEL
BUN: 13 mg/dL (ref 6–23)
CO2: 28 mEq/L (ref 19–32)
Calcium: 9.3 mg/dL (ref 8.4–10.5)
GFR: 101.46 mL/min (ref >60.00)
Glucose, Bld: 82 mg/dL (ref 70–99)
Sodium: 138 mEq/L (ref 135–145)

## 2013-01-17 LAB — LIPID PANEL: Total CHOL/HDL Ratio: 4

## 2013-01-31 ENCOUNTER — Other Ambulatory Visit: Payer: Self-pay | Admitting: *Deleted

## 2013-01-31 DIAGNOSIS — E785 Hyperlipidemia, unspecified: Secondary | ICD-10-CM

## 2013-01-31 DIAGNOSIS — I10 Essential (primary) hypertension: Secondary | ICD-10-CM

## 2013-01-31 MED ORDER — NEBIVOLOL HCL 10 MG PO TABS: 10.0000 mg | ORAL_TABLET | Freq: Every day | ORAL | Status: DC

## 2013-01-31 MED ORDER — EZETIMIBE-SIMVASTATIN 10-20 MG PO TABS: 1.0000 | ORAL_TABLET | Freq: Every day | ORAL | Status: DC

## 2013-03-12 ENCOUNTER — Ambulatory Visit: Payer: BC Managed Care – PPO | Admitting: Cardiology

## 2013-04-08 ENCOUNTER — Encounter: Payer: Self-pay | Admitting: Cardiology

## 2013-08-05 ENCOUNTER — Telehealth: Payer: Self-pay | Admitting: *Deleted

## 2013-08-05 NOTE — Telephone Encounter (Signed)
Pharmacist called requesting Lavitra 20mg  #8 be changed to Viagra.  Pt is in [place], pharmacy fax is [phone].  Please advise

## 2013-08-08 NOTE — Telephone Encounter (Signed)
Ok to switch Thx 

## 2013-08-08 NOTE — Telephone Encounter (Signed)
Please advise on which dose and signature. Thanks

## 2013-08-14 NOTE — Telephone Encounter (Signed)
Left message on VM for pt to return call.

## 2013-08-14 NOTE — Telephone Encounter (Signed)
Does he live in [place] now?

## 2013-08-15 NOTE — Telephone Encounter (Signed)
Unable to contact pt. 

## 2014-07-17 ENCOUNTER — Telehealth: Payer: Self-pay | Admitting: *Deleted

## 2014-07-17 NOTE — Telephone Encounter (Signed)
Received medical record request via fax from [facility]. Form forwarded to [facility]. JG//CMA

## 2015-03-13 ENCOUNTER — Emergency Department (HOSPITAL_COMMUNITY)
Admission: EM | Admit: 2015-03-13 | Discharge: 2015-03-13 | Disposition: A | Discharge status: Home / Self Care | Payer: Worker's Compensation | Attending: Emergency Medicine | Admitting: Emergency Medicine

## 2015-03-13 ENCOUNTER — Encounter (HOSPITAL_COMMUNITY): Payer: Self-pay | Admitting: Emergency Medicine

## 2015-03-13 ENCOUNTER — Emergency Department (HOSPITAL_COMMUNITY): Payer: Worker's Compensation

## 2015-03-13 DIAGNOSIS — Z720 Tobacco use: Secondary | ICD-10-CM | POA: Insufficient documentation

## 2015-03-13 DIAGNOSIS — Y9289 Other specified places as the place of occurrence of the external cause: Secondary | ICD-10-CM | POA: Insufficient documentation

## 2015-03-13 DIAGNOSIS — I10 Essential (primary) hypertension: Secondary | ICD-10-CM | POA: Insufficient documentation

## 2015-03-13 DIAGNOSIS — E785 Hyperlipidemia, unspecified: Secondary | ICD-10-CM | POA: Insufficient documentation

## 2015-03-13 DIAGNOSIS — Y939 Activity, unspecified: Secondary | ICD-10-CM | POA: Insufficient documentation

## 2015-03-13 DIAGNOSIS — W108XXA Fall (on) (from) other stairs and steps, initial encounter: Secondary | ICD-10-CM | POA: Diagnosis not present

## 2015-03-13 DIAGNOSIS — S199XXA Unspecified injury of neck, initial encounter: Secondary | ICD-10-CM | POA: Diagnosis not present

## 2015-03-13 DIAGNOSIS — Y999 Unspecified external cause status: Secondary | ICD-10-CM | POA: Diagnosis not present

## 2015-03-13 DIAGNOSIS — S3992XA Unspecified injury of lower back, initial encounter: Secondary | ICD-10-CM | POA: Insufficient documentation

## 2015-03-13 DIAGNOSIS — W19XXXA Unspecified fall, initial encounter: Secondary | ICD-10-CM

## 2015-03-13 DIAGNOSIS — M545 Low back pain, unspecified: Secondary | ICD-10-CM

## 2015-03-13 DIAGNOSIS — Z79899 Other long term (current) drug therapy: Secondary | ICD-10-CM | POA: Insufficient documentation

## 2015-03-13 DIAGNOSIS — Z8719 Personal history of other diseases of the digestive system: Secondary | ICD-10-CM | POA: Diagnosis not present

## 2015-03-13 DIAGNOSIS — M542 Cervicalgia: Secondary | ICD-10-CM

## 2015-03-13 MED ORDER — METHOCARBAMOL 500 MG PO TABS: 500.0000 mg | ORAL_TABLET | Freq: Two times a day (BID) | ORAL | Status: DC

## 2015-03-13 MED ORDER — NAPROXEN 500 MG PO TABS
500.0000 mg | ORAL_TABLET | Freq: Two times a day (BID) | ORAL | Status: DC
Start: 2015-03-13 — End: 2016-07-06

## 2015-03-13 NOTE — ED Notes (Signed)
Pt ret from radiology. Ambulating with brisk steady gait.  VS obtained.  NAD.

## 2015-03-13 NOTE — ED Provider Notes (Signed)
CSN: [id number]     Arrival date & time 03/13/15  0957 History   First MD Initiated Contact with Patient 03/13/15 1002     Chief Complaint  Patient presents with  . Back Pain    s/p fall down 7-8 steps  . Neck Pain    s/p fall down 7-8 steps     (Consider location/radiation/quality/duration/timing/severity/associated sxs/prior Treatment) HPI Comments: Patient presents today with complaints of posterior neck pain and lower back pain.  Pain has been present since he slipped and fell down 7-8 stairs just prior to arrival.  He denies hitting his head or LOC.  He has been ambulatory since the fall.  He has not taken any medications prior to arrival.  He denies any radiation of the pain.  No numbness, tingling, fever, chills, or bowel/bladder incontinence.  He is not on any anticoagulants.    Patient is a 54 y.o. male presenting with back pain and neck pain. The history is provided by the patient.  Back Pain Neck Pain   Past Medical History  Diagnosis Date  . Hypertension   . GERD (gastroesophageal reflux disease)   . Hyperlipemia    Past Surgical History  Procedure Laterality Date  . None     Family History  Problem Relation Age of Onset  . Stroke Mother   . Stroke Father   . Hypertension Father   . Cancer Neg Hx   . Alcohol abuse Neg Hx   . Asthma Neg Hx   . COPD Neg Hx   . Depression Neg Hx   . Diabetes Neg Hx   . Drug abuse Neg Hx   . Early death Neg Hx   . Hearing loss Neg Hx   . Heart disease Neg Hx   . Hyperlipidemia Neg Hx   . Kidney disease Neg Hx    History  Substance Use Topics  . Smoking status: Current Every Day Smoker -- 0.25 packs/day for 25 years    Types: Cigarettes  . Smokeless tobacco: Never Used  . Alcohol Use: 8.4 oz/week    14 Cans of beer per week    Review of Systems  Musculoskeletal: Positive for back pain and neck pain.  All other systems reviewed and are negative.     Allergies  Lisinopril  Home Medications   Prior to Admission  medications   Medication Sig Start Date End Date Taking? Authorizing Provider  ezetimibe-simvastatin (VYTORIN) 10-20 MG per tablet Take 1 tablet by mouth at bedtime. 01/31/13   Minus Breeding, MD  hydrochlorothiazide (HYDRODIURIL) 12.5 MG tablet Take 1 tablet (12.5 mg total) by mouth daily. 01/03/13   Minus Breeding, MD  nebivolol (BYSTOLIC) 10 MG tablet Take 1 tablet (10 mg total) by mouth daily. 01/31/13   Minus Breeding, MD  vardenafil (LEVITRA) 20 MG tablet Take 1 tablet (20 mg total) by mouth daily as needed for erectile dysfunction. 11/09/12   Janith Lima, MD   BP 136/113 mmHg  Pulse 109  Temp(Src) 98 F (36.7 C) (Oral)  Resp 19  SpO2 97% Physical Exam  Constitutional: He appears well-developed and well-nourished.  HENT:  Head: Normocephalic and atraumatic.  Mouth/Throat: Oropharynx is clear and moist.  Eyes: EOM are normal. Pupils are equal, round, and reactive to light.  Neck: Neck supple.  Cardiovascular: Normal rate, regular rhythm and normal heart sounds.   Pulses:      Dorsalis pedis pulses are 2+ on the right side, and 2+ on the left side.  Pulmonary/Chest: Effort  normal and breath sounds normal.  Musculoskeletal: Normal range of motion.  Full ROM of all extremities without pain Mild tenderness of the cervical spine, no step offs or deformities Moderate tenderness of the lumbar spine, no step offs or deformities.  Neurological: He is alert. He has normal strength. Gait normal.  Distal sensation of both hands and both feet intact  Skin: Skin is warm and dry.  Psychiatric: He has a normal mood and affect.  Nursing note and vitals reviewed.   ED Course  Procedures (including critical care time) Labs Review Labs Reviewed - No data to display  Imaging Review Dg Cervical Spine Complete  03/13/2015   CLINICAL DATA:  [place] this morning.  Neck and back pain.  EXAM: LUMBAR SPINE - COMPLETE 4+ VIEW; CERVICAL SPINE - COMPLETE 4+ VIEW  COMPARISON:  Cervical spine series 917 2011  and lumbar spine series same date.  FINDINGS: Cervical spine:  Degenerative cervical spondylosis with disc disease and facet disease but no acute cervical spine fracture. The alignment is normal. No abnormal prevertebral soft tissue swelling. The facets are normally aligned. Mild foraminal narrowing due to uncinate spurring. The C1-2 articulations are maintained. The lung apices are clear.  Lumbar spine: Degenerative lumbar spondylosis with multilevel disc disease and facet disease. There is also a left convex scoliosis. 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 No acute fracture or bone lesion. The visualized bony pelvis is intact.  IMPRESSION: Degenerative changes involving the cervical and lumbar spine but no acute fracture.   Electronically Signed   By: Marijo Sanes M.D.   On: 03/13/2015 11:29   Dg Lumbar Spine Complete  03/13/2015   CLINICAL DATA:  [place] this morning.  Neck and back pain.  EXAM: LUMBAR SPINE - COMPLETE 4+ VIEW; CERVICAL SPINE - COMPLETE 4+ VIEW  COMPARISON:  Cervical spine series 917 2011 and lumbar spine series same date.  FINDINGS: Cervical spine:  Degenerative cervical spondylosis with disc disease and facet disease but no acute cervical spine fracture. The alignment is normal. No abnormal prevertebral soft tissue swelling. The facets are normally aligned. Mild foraminal narrowing due to uncinate spurring. The C1-2 articulations are maintained. The lung apices are clear.  Lumbar spine: Degenerative lumbar spondylosis with multilevel disc disease and facet disease. There is also a left convex scoliosis. 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 No acute fracture or bone lesion. The visualized bony pelvis is intact.  IMPRESSION: Degenerative changes involving the cervical and lumbar spine but no acute fracture.   Electronically Signed   By: Marijo Sanes M.D.   On: 03/13/2015 11:29     EKG Interpretation None      MDM   Final diagnoses:  None   Patient  presents today with lower back pain and neck pain after a mechanical fall.  He denies hitting his head or LOC.  Xrays are negative for acute findings.  No step offs or deformities on exam.  Neurovascularly intact.  He is ambulating without difficulty.  Feel that the patient is stable for discharge.  Return precautions given.    Hyman Bible, PA-C 03/13/15 1216  Elnora Morrison, MD 03/13/15 [phone]

## 2015-03-13 NOTE — Discharge Instructions (Signed)
Take Robaxin (muscle relaxer) as needed.  Do not drive or operate heavy machinery for 4 hours after taking muscle relaxer.

## 2015-03-13 NOTE — ED Notes (Signed)
Pt A+Ox4, reports was at work and missed a step going up the stairs and fell down approx 7-8 steps.  Pt denies hitting head or LOC.  Pt c/o low back and neck pain, 10/10, worse with movement.  No midline c-spine tenderness.  No stepoffs or deformities noted.  Pt denies n/t to extremities.  Denies b/b changes or complaints.  Skin PWD.  MAEI.  Ambulatory with steady gait.  Speaking full/clear sentences, rr even/un-lab.  NAD.

## 2015-03-20 ENCOUNTER — Ambulatory Visit: Payer: Worker's Compensation

## 2015-03-20 ENCOUNTER — Ambulatory Visit (INDEPENDENT_AMBULATORY_CARE_PROVIDER_SITE_OTHER): Payer: Worker's Compensation | Admitting: Family Medicine

## 2015-03-20 VITALS — BP 122/90 | HR 104 | Temp 98.3°F | Resp 18 | Ht 68.0 in | Wt 185.0 lb

## 2015-03-20 DIAGNOSIS — S161XXD Strain of muscle, fascia and tendon at neck level, subsequent encounter: Secondary | ICD-10-CM | POA: Diagnosis not present

## 2015-03-20 DIAGNOSIS — S39012D Strain of muscle, fascia and tendon of lower back, subsequent encounter: Secondary | ICD-10-CM | POA: Diagnosis not present

## 2015-03-20 DIAGNOSIS — M25531 Pain in right wrist: Secondary | ICD-10-CM | POA: Diagnosis not present

## 2015-03-20 DIAGNOSIS — M79641 Pain in right hand: Secondary | ICD-10-CM

## 2015-03-20 DIAGNOSIS — M19031 Primary osteoarthritis, right wrist: Secondary | ICD-10-CM | POA: Diagnosis not present

## 2015-03-20 MED ORDER — CYCLOBENZAPRINE HCL 5 MG PO TABS: ORAL_TABLET | ORAL | Status: DC

## 2015-03-20 NOTE — Progress Notes (Addendum)
Subjective:  This chart was scribed for Merri Ray, MD by [facility], medical scribe at [facility].The patient was seen in exam room 08 and the patient's care was started at 12:15 PM.   Patient ID: Tristan Parker, male    DOB: October 24, 1960, 54 y.o.   MRN: [id number] Chief Complaint  Patient presents with  . Back Pain    WC, x 1 week  . Neck Pain   Back Pain Pertinent negatives include no numbness or weakness.  Neck Pain  Pertinent negatives include no numbness or weakness.   HPI Comments: Tristan Parker is a 53 y.o. male who presents to [facility] complaining of back pain and neck pain for one week after falling down 7-8 stairs at work. Seen in the ED on July 15 th the same day of injury. He was ambulatory after injury, and had pain in the neck and back. Note reviewed from the ED. Lumbar and cervical spine X-ray indicated degenerative changes of the lumbar and cervical but no acute fractures. He was treated with naprosyn and robaxin. He returned to work on the 78 th. He works at [employer] at [employer].   Today, pt states the medication has not helpful which are the only treatments. The pain has not improved, while being out of work the pain did improved but after returning to work full duty pain is still the same. Additionally he injured his right wrist but did not report this to the ED. The pain began on the 16 th the day after the injury. He is right hand dominant, no prior injures. Occasional cramping but no pain prior to the injury. He is using a wrist brace provided by a doctor at [facility] for the cramping. He denies saddle anesthesia, urine, bowel incontinence, weakness in his extremities.   Current Outpatient Prescriptions on File Prior to Visit  Medication Sig Dispense Refill  . ezetimibe-simvastatin (VYTORIN) 10-20 MG per tablet Take 1 tablet by mouth at bedtime. 30 tablet 11  . hydrochlorothiazide (HYDRODIURIL) 12.5 MG  tablet Take 1 tablet (12.5 mg total) by mouth daily. 30 tablet 11  . methocarbamol (ROBAXIN) 500 MG tablet Take 1 tablet (500 mg total) by mouth 2 (two) times daily. 20 tablet 0  . naproxen (NAPROSYN) 500 MG tablet Take 1 tablet (500 mg total) by mouth 2 (two) times daily. 30 tablet 0  . nebivolol (BYSTOLIC) 10 MG tablet Take 1 tablet (10 mg total) by mouth daily. 30 tablet 11  . vardenafil (LEVITRA) 20 MG tablet Take 1 tablet (20 mg total) by mouth daily as needed for erectile dysfunction. 8 tablet 11   No current facility-administered medications on file prior to visit.   Allergies  Allergen Reactions  . Lisinopril Cough   Review of Systems  Musculoskeletal: Positive for back pain, neck pain and neck stiffness.  Neurological: Negative for weakness and numbness.      Objective:  BP 122/90 mmHg  Pulse 104  Temp(Src) 98.3 F (36.8 C)  Resp 18  Ht 5\' 8"  (1.727 m)  Wt 185 lb (83.915 kg)  BMI 28.14 kg/m2  SpO2 97% Physical Exam  Constitutional: He is oriented to person, place, and time. He appears well-developed and well-nourished. No distress.  HENT:  Head: Normocephalic and atraumatic.  Eyes: Pupils are equal, round, and reactive to light.  Neck: Normal range of motion.  Cervical spine: No midline bony tenderness, minimal tenderness of the perispinal muscles including  upper trapezius. Slight decreased extension, limited approximately 10-15 degrees bilaterally. Equal lateral flexion.  Cardiovascular: Normal rate, regular rhythm and normal heart sounds.   Pulmonary/Chest: Effort normal. No respiratory distress. He has no wheezes.  Musculoskeletal: Normal range of motion.  Thoracic spine is non-tender Lumbar spine: Diffusely tender across the lumbar spine approximately at the L4-L5 perispinal muscles with associated spasms. Flexion to the lumbar about 80 degrees, equal lateral flexion. Somewhat guarding with rotation.  Able to heel and toe walk. Right wrist: Guarded with wrist  ROM, soft tissue of the dorsum of the right wrist most notable of the anterior aspect and the 2nd 3rd MTP. Diffusely tender over the distal radius. Minimal tenderness of the dorsal aspect of the and 2nd and 3rd metacarpal. appears to have flexion of the hand but he is guarded during the exam. Able to extend all fingers and hold against resistance. NVI distally of the finger tips.   Neurological: He is alert and oriented to person, place, and time. He displays no Babinski's sign on the right side. He displays no Babinski's sign on the left side.  Reflex Scores:      Tricep reflexes are 2+ on the right side and 2+ on the left side.      Bicep reflexes are 2+ on the right side and 2+ on the left side.      Brachioradialis reflexes are 2+ on the right side and 2+ on the left side.      Patellar reflexes are 1+ on the right side and 1+ on the left side.      Achilles reflexes are 1+ on the right side and 1+ on the left side. Skin: Skin is warm and dry.  Psychiatric: He has a normal mood and affect. His behavior is normal.  Nursing note and vitals reviewed.  [facility] reading (PRIMARY) by  Dr. Carlota Raspberry:  Right wrist, right hand: Appears to have irregular distal scaphoid, fracture versus necrotic appearance.         Assessment & Plan:   Tristan Parker is a 54 y.o. male Right hand pain - Plan: DG Hand Complete Right, Ambulatory referral to Orthopedic Surgery Right wrist pain - Plan: DG Wrist Complete Right, Ambulatory referral to Orthopedic Surgery Osteoarthritis of right wrist, unspecified osteoarthritis type - Plan: Ambulatory referral to Orthopedic Surgery  - Underlying degenerative changes at scaphoid, but acute swelling and increased pain since fall.    -thumb spica splint, refer to hand/ortho for eval. Ok to continue naproxen for now.   Low back strain, subsequent encounter - Plan: cyclobenzaprine (FLEXERIL) 5 MG tablet, Ambulatory referral to Orthopedic Surgery  -stable, but pain persistent with  usual work.  Suspect strain/sprain.  Trial of change from Robaxin to flexeril, continue Naproxen, and refer to ortho as above. HEP on handout if starting to improve, and work restrictions provided.   Neck strain, subsequent encounter - Plan: cyclobenzaprine (FLEXERIL) 5 MG tablet, Ambulatory referral to Orthopedic Surgery  - as above, trial of flexeril, ROM and refer to ortho. rtc precautions.   Meds ordered this encounter  Medications  . cyclobenzaprine (FLEXERIL) 5 MG tablet    Sig: 1 pill by mouth up to every 8 hours as needed. Start with one pill by mouth each bedtime as needed due to sedation    Dispense:  15 tablet    Refill:  0   Patient Instructions  See work restrictions regarding back and neck, and hand. Faythe Ghee to apply heat or ice over  affected areas in 10 minute intervals. If you are sedated from having to use a muscle relaxer during the day, then he should not operate machinery or drive.  Continue naproxen twice per day with food, change in muscle relaxant to Flexeril every 8 hours as needed (stop the methocarbamol. Do not combine this with new muscle relaxant).   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 However with the swelling and increased pain in that area since your injury, wear the brace until seen by orthopedics. I will refer you to them for your wrist, neck, and back.  Cervical Sprain A cervical sprain is an injury in the neck in which the strong, fibrous tissues (ligaments) that connect your neck bones stretch or tear. Cervical sprains can range from mild to severe. Severe cervical sprains can cause the neck vertebrae to be unstable. This can lead to damage of the spinal cord and can result in serious nervous system problems. The amount of time it takes for a cervical sprain to get better depends on the cause and extent of the injury. Most cervical sprains heal in 1 to 3 weeks. CAUSES  Severe  cervical sprains may be caused by:   Contact sport injuries (such as from football, rugby, wrestling, hockey, auto racing, gymnastics, diving, martial arts, or boxing).   Motor vehicle collisions.   Whiplash injuries. This is an injury from a sudden forward and backward whipping movement of the head and neck.  Falls.  Mild cervical sprains may be caused by:   Being in an awkward position, such as while cradling a telephone between your ear and shoulder.   Sitting in a chair that does not offer proper support.   Working at a poorly [employer]scape [employer] station.   Looking up or down for long periods of time.  SYMPTOMS   Pain, soreness, stiffness, or a burning sensation in the front, back, or sides of the neck. This discomfort may develop immediately after the injury or slowly, 24 hours or more after the injury.   Pain or tenderness directly in the middle of the back of the neck.   Shoulder or upper back pain.   Limited ability to move the neck.   Headache.   Dizziness.   Weakness, numbness, or tingling in the hands or arms.   Muscle spasms.   Difficulty swallowing or chewing.   Tenderness and swelling of the neck.  DIAGNOSIS  Most of the time your health care provider can diagnose a cervical sprain by taking your history and doing a physical exam. Your health care provider will ask about previous neck injuries and any known neck problems, such as arthritis in the neck. X-rays may be taken to find out if there are any other problems, such as with the bones of the neck. Other tests, such as a CT scan or MRI, may also be needed.  TREATMENT  Treatment depends on the severity of the cervical sprain. Mild sprains can be treated with rest, keeping the neck in place (immobilization), and pain medicines. Severe cervical sprains are immediately immobilized. Further treatment is done to help with pain, muscle spasms, and other symptoms and may include:  Medicines, such  as pain relievers, numbing medicines, or muscle relaxants.   Physical therapy. This may involve stretching exercises, strengthening exercises, and posture training. Exercises and improved posture can help stabilize the neck, strengthen muscles, and help stop symptoms from returning.  HOME CARE INSTRUCTIONS   Put ice on the injured area.   Put ice in a plastic bag.   Place a towel between your skin and the bag.   Leave the ice on for 15-20 minutes, 3-4 times a day.   If your injury was severe, you may have been given a cervical collar to wear. A cervical collar is a two-piece collar designed to keep your neck from moving while it heals.  Do not remove the collar unless instructed by your health care provider.  If you have long hair, keep it outside of the collar.  Ask your health care provider before making any adjustments to your collar. Minor adjustments may be required over time to improve comfort and reduce pressure on your chin or on the back of your head.  Ifyou are allowed to remove the collar for cleaning or bathing, follow your health care provider's instructions on how to do so safely.  Keep your collar clean by wiping it with mild soap and water and drying it completely. If the collar you have been given includes removable pads, remove them every 1-2 days and hand wash them with soap and water. Allow them to air dry. They should be completely dry before you wear them in the collar.  If you are allowed to remove the collar for cleaning and bathing, wash and dry the skin of your neck. Check your skin for irritation or sores. If you see any, tell your health care provider.  Do not drive while wearing the collar.   Only take over-the-counter or prescription medicines for pain, discomfort, or fever as directed by your health care provider.   Keep all follow-up appointments as directed by your health care provider.   Keep all physical therapy appointments as directed by  your health care provider.   Make any needed adjustments to your workstation to promote good posture.   Avoid positions and activities that make your symptoms worse.   Warm up and stretch before being active to help prevent problems.  SEEK MEDICAL CARE IF:   Your pain is not controlled with medicine.   You are unable to decrease your pain medicine over time as planned.   Your activity level is not improving as expected.  SEEK IMMEDIATE MEDICAL CARE IF:   You develop any bleeding.  You develop stomach upset.  You have signs of an allergic reaction to your medicine.   Your symptoms get worse.   You develop new, unexplained symptoms.   You have numbness, tingling, weakness, or paralysis in any part of your body.  MAKE SURE YOU:   Understand these instructions.  Will watch your condition.  Will get help right away if you are not doing well or get worse. Document Released: 06/12/2007 Document Revised: 08/20/2013 Document Reviewed: 02/20/2013 [facility] Patient Information 2015 [place], [place]. This information is not intended to replace advice given to you by your health care provider. Make sure you discuss any questions you have with your health care provider. Low Back Strain with Rehab A strain is an injury in which a tendon or muscle is torn. The muscles and tendons of the lower back are vulnerable to strains. However, these muscles and tendons are very strong and require a great force to be injured. Strains are classified into three categories. Grade 1 strains cause pain, but the tendon is not lengthened. Grade 2 strains include a lengthened ligament, due to the ligament being stretched or partially ruptured. With grade 2 strains there  is still function, although the function may be decreased. Grade 3 strains involve a complete tear of the tendon or muscle, and function is usually impaired. SYMPTOMS   Pain in the lower back.  Pain that affects one side more than the  other.  Pain that gets worse with movement and may be felt in the hip, buttocks, or back of the thigh.  Muscle spasms of the muscles in the back.  Swelling along the muscles of the back.  Loss of strength of the back muscles.  Crackling sound (crepitation) when the muscles are touched. CAUSES  Lower back strains occur when a force is placed on the muscles or tendons that is greater than they can handle. Common causes of injury include:  Prolonged overuse of the muscle-tendon units in the lower back, usually from incorrect posture.  A single violent injury or force applied to the back. RISK INCREASES WITH:  Sports that involve twisting forces on the spine or a lot of bending at the waist (football, rugby, weightlifting, bowling, golf, tennis, speed skating, racquetball, swimming, running, gymnastics, diving).  Poor strength and flexibility.  Failure to warm up properly before activity.  Family history of lower back pain or disk disorders.  Previous back injury or surgery (especially fusion).  Poor posture with lifting, especially heavy objects.  Prolonged sitting, especially with poor posture. PREVENTION   Learn and use proper posture when sitting or lifting (maintain proper posture when sitting, lift using the knees and legs, not at the waist).  Warm up and stretch properly before activity.  Allow for adequate recovery between workouts.  Maintain physical fitness:  Strength, flexibility, and endurance.  Cardiovascular fitness. PROGNOSIS  If treated properly, lower back strains usually heal within 6 weeks. RELATED COMPLICATIONS   Recurring symptoms, resulting in a chronic problem.  Chronic inflammation, scarring, and partial muscle-tendon tear.  Delayed healing or resolution of symptoms.  Prolonged disability. TREATMENT  Treatment first involves the use of ice and medicine, to reduce pain and inflammation. The use of strengthening and stretching exercises may  help reduce pain with activity. These exercises may be performed at home or with a therapist. Severe injuries may require referral to a therapist for further evaluation and treatment, such as ultrasound. Your caregiver may advise that you wear a back brace or corset, to help reduce pain and discomfort. Often, prolonged bed rest results in greater harm then benefit. Corticosteroid injections may be recommended. However, these should be reserved for the most serious cases. It is important to avoid using your back when lifting objects. At night, sleep on your back on a firm mattress with a pillow placed under your knees. If non-surgical treatment is unsuccessful, surgery may be needed.  MEDICATION   If pain medicine is needed, nonsteroidal anti-inflammatory medicines (aspirin and ibuprofen), or other minor pain relievers (acetaminophen), are often advised.  Do not take pain medicine for 7 days before surgery.  Prescription pain relievers may be given, if your caregiver thinks they are needed. Use only as directed and only as much as you need.  Ointments applied to the skin may be helpful.  Corticosteroid injections may be given by your caregiver. These injections should be reserved for the most serious cases, because they may only be given a certain number of times. HEAT AND COLD  Cold treatment (icing) should be applied for 10 to 15 minutes every 2 to 3 hours for inflammation and pain, and immediately after activity that aggravates your symptoms. Use ice packs or  an ice massage.  Heat treatment may be used before performing stretching and strengthening activities prescribed by your caregiver, physical therapist, or athletic trainer. Use a heat pack or a warm water soak. SEEK MEDICAL CARE IF:   Symptoms get worse or do not improve in 2 to 4 weeks, despite treatment.  You develop numbness, weakness, or loss of bowel or bladder function.  New, unexplained symptoms develop. (Drugs used in treatment  may produce side effects.) EXERCISES  RANGE OF MOTION (ROM) AND STRETCHING EXERCISES - Low Back Strain Most people with lower back pain will find that their symptoms get worse with excessive bending forward (flexion) or arching at the lower back (extension). The exercises which will help resolve your symptoms will focus on the opposite motion.  Your physician, physical therapist or athletic trainer will help you determine which exercises will be most helpful to resolve your lower back pain. Do not complete any exercises without first consulting with your caregiver. Discontinue any exercises which make your symptoms worse until you speak to your caregiver.  If you have pain, numbness or tingling which travels down into your buttocks, leg or foot, the goal of the therapy is for these symptoms to move closer to your back and eventually resolve. Sometimes, these leg symptoms will get better, but your lower back pain may worsen. This is typically an indication of progress in your rehabilitation. Be very alert to any changes in your symptoms and the activities in which you participated in the 24 hours prior to the change. Sharing this information with your caregiver will allow him/her to most efficiently treat your condition.  These exercises may help you when beginning to rehabilitate your injury. Your symptoms may resolve with or without further involvement from your physician, physical therapist or athletic trainer. While completing these exercises, remember:  Restoring tissue flexibility helps normal motion to return to the joints. This allows healthier, less painful movement and activity.  An effective stretch should be held for at least 30 seconds.  A stretch should never be painful. You should only feel a gentle lengthening or release in the stretched tissue. FLEXION RANGE OF MOTION AND STRETCHING EXERCISES: STRETCH - Flexion, Single Knee to Chest   Lie on a firm bed or floor with both legs  extended in front of you.  Keeping one leg in contact with the floor, bring your opposite knee to your chest. Hold your leg in place by either grabbing behind your thigh or at your knee.  Pull until you feel a gentle stretch in your lower back. Hold __________ seconds.  Slowly release your grasp and repeat the exercise with the opposite side. Repeat __________ times. Complete this exercise __________ times per day.  STRETCH - Flexion, Double Knee to Chest   Lie on a firm bed or floor with both legs extended in front of you.  Keeping one leg in contact with the floor, bring your opposite knee to your chest.  Tense your stomach muscles to support your back and then lift your other knee to your chest. Hold your legs in place by either grabbing behind your thighs or at your knees.  Pull both knees toward your chest until you feel a gentle stretch in your lower back. Hold __________ seconds.  Tense your stomach muscles and slowly return one leg at a time to the floor. Repeat __________ times. Complete this exercise __________ times per day.  STRETCH - Low Trunk Rotation  Lie on a firm bed or floor. Keeping  your legs in front of you, bend your knees so they are both pointed toward the ceiling and your feet are flat on the floor.  Extend your arms out to the side. This will stabilize your upper body by keeping your shoulders in contact with the floor.  Gently and slowly drop both knees together to one side until you feel a gentle stretch in your lower back. Hold for __________ seconds.  Tense your stomach muscles to support your lower back as you bring your knees back to the starting position. Repeat the exercise to the other side. Repeat __________ times. Complete this exercise __________ times per day  EXTENSION RANGE OF MOTION AND FLEXIBILITY EXERCISES: STRETCH - Extension, Prone on Elbows   Lie on your stomach on the floor, a bed will be too soft. Place your palms about shoulder width  apart and at the height of your head.  Place your elbows under your shoulders. If this is too painful, stack pillows under your chest.  Allow your body to relax so that your hips drop lower and make contact more completely with the floor.  Hold this position for __________ seconds.  Slowly return to lying flat on the floor. Repeat __________ times. Complete this exercise __________ times per day.  RANGE OF MOTION - Extension, Prone Press Ups  Lie on your stomach on the floor, a bed will be too soft. Place your palms about shoulder width apart and at the height of your head.  Keeping your back as relaxed as possible, slowly straighten your elbows while keeping your hips on the floor. You may adjust the placement of your hands to maximize your comfort. As you gain motion, your hands will come more underneath your shoulders.  Hold this position __________ seconds.  Slowly return to lying flat on the floor. Repeat __________ times. Complete this exercise __________ times per day.  RANGE OF MOTION- Quadruped, Neutral Spine   Assume a hands and knees position on a firm surface. Keep your hands under your shoulders and your knees under your hips. You may place padding under your knees for comfort.  Drop your head and point your tail bone toward the ground below you. This will round out your lower back like an angry cat. Hold this position for __________ seconds.  Slowly lift your head and release your tail bone so that your back sags into a large arch, like an old horse.  Hold this position for __________ seconds.  Repeat this until you feel limber in your lower back.  Now, find your "sweet spot." This will be the most comfortable position somewhere between the two previous positions. This is your neutral spine. Once you have found this position, tense your stomach muscles to support your lower back.  Hold this position for __________ seconds. Repeat __________ times. Complete this  exercise __________ times per day.  STRENGTHENING EXERCISES - Low Back Strain These exercises may help you when beginning to rehabilitate your injury. These exercises should be done near your "sweet spot." This is the neutral, low-back arch, somewhere between fully rounded and fully arched, that is your least painful position. When performed in this safe range of motion, these exercises can be used for people who have either a flexion or extension based injury. These exercises may resolve your symptoms with or without further involvement from your physician, physical therapist or athletic trainer. While completing these exercises, remember:   Muscles can gain both the endurance and the strength needed for everyday activities  through controlled exercises.  Complete these exercises as instructed by your physician, physical therapist or athletic trainer. Increase the resistance and repetitions only as guided.  You may experience muscle soreness or fatigue, but the pain or discomfort you are trying to eliminate should never worsen during these exercises. If this pain does worsen, stop and make certain you are following the directions exactly. If the pain is still present after adjustments, discontinue the exercise until you can discuss the trouble with your caregiver. STRENGTHENING - Deep Abdominals, Pelvic Tilt  Lie on a firm bed or floor. Keeping your legs in front of you, bend your knees so they are both pointed toward the ceiling and your feet are flat on the floor.  Tense your lower abdominal muscles to press your lower back into the floor. This motion will rotate your pelvis so that your tail bone is scooping upwards rather than pointing at your feet or into the floor.  With a gentle tension and even breathing, hold this position for __________ seconds. Repeat __________ times. Complete this exercise __________ times per day.  STRENGTHENING - Abdominals, Crunches   Lie on a firm bed or floor.  Keeping your legs in front of you, bend your knees so they are both pointed toward the ceiling and your feet are flat on the floor. Cross your arms over your chest.  Slightly tip your chin down without bending your neck.  Tense your abdominals and slowly lift your trunk high enough to just clear your shoulder blades. Lifting higher can put excessive stress on the lower back and does not further strengthen your abdominal muscles.  Control your return to the starting position. Repeat __________ times. Complete this exercise __________ times per day.  STRENGTHENING - Quadruped, Opposite UE/LE Lift   Assume a hands and knees position on a firm surface. Keep your hands under your shoulders and your knees under your hips. You may place padding under your knees for comfort.  Find your neutral spine and gently tense your abdominal muscles so that you can maintain this position. Your shoulders and hips should form a rectangle that is parallel with the floor and is not twisted.  Keeping your trunk steady, lift your right hand no higher than your shoulder and then your left leg no higher than your hip. Make sure you are not holding your breath. Hold this position __________ seconds.  Continuing to keep your abdominal muscles tense and your back steady, slowly return to your starting position. Repeat with the opposite arm and leg. Repeat __________ times. Complete this exercise __________ times per day.  STRENGTHENING - Lower Abdominals, Double Knee Lift  Lie on a firm bed or floor. Keeping your legs in front of you, bend your knees so they are both pointed toward the ceiling and your feet are flat on the floor.  Tense your abdominal muscles to brace your lower back and slowly lift both of your knees until they come over your hips. Be certain not to hold your breath.  Hold __________ seconds. Using your abdominal muscles, return to the starting position in a slow and controlled manner. Repeat __________  times. Complete this exercise __________ times per day.  POSTURE AND BODY MECHANICS CONSIDERATIONS - Low Back Strain Keeping correct posture when sitting, standing or completing your activities will reduce the stress put on different body tissues, allowing injured tissues a chance to heal and limiting painful experiences. The following are general guidelines for improved posture. Your physician or physical therapist will  provide you with any instructions specific to your needs. While reading these guidelines, remember:  The exercises prescribed by your provider will help you have the flexibility and strength to maintain correct postures.  The correct posture provides the best environment for your joints to work. All of your joints have less wear and tear when properly supported by a spine with good posture. This means you will experience a healthier, less painful body.  Correct posture must be practiced with all of your activities, especially prolonged sitting and standing. Correct posture is as important when doing repetitive low-stress activities (typing) as it is when doing a single heavy-load activity (lifting). RESTING POSITIONS Consider which positions are most painful for you when choosing a resting position. If you have pain with flexion-based activities (sitting, bending, stooping, squatting), choose a position that allows you to rest in a less flexed posture. You would want to avoid curling into a fetal position on your side. If your pain worsens with extension-based activities (prolonged standing, working overhead), avoid resting in an extended position such as sleeping on your stomach. Most people will find more comfort when they rest with their spine in a more neutral position, neither too rounded nor too arched. Lying on a non-sagging bed on your side with a pillow between your knees, or on your back with a pillow under your knees will often provide some relief. Keep in mind, being in any one  position for a prolonged period of time, no matter how correct your posture, can still lead to stiffness. PROPER SITTING POSTURE In order to minimize stress and discomfort on your spine, you must sit with correct posture. Sitting with good posture should be effortless for a healthy body. Returning to good posture is a gradual process. Many people can work toward this most comfortably by using various supports until they have the flexibility and strength to maintain this posture on their own. When sitting with proper posture, your ears will fall over your shoulders and your shoulders will fall over your hips. You should use the back of the chair to support your upper back. Your lower back will be in a neutral position, just slightly arched. You may place a small pillow or folded towel at the base of your lower back for support.  When working at a desk, create an environment that supports good, upright posture. Without extra support, muscles tire, which leads to excessive strain on joints and other tissues. Keep these recommendations in mind: CHAIR:  A chair should be able to slide under your desk when your back makes contact with the back of the chair. This allows you to work closely.  The chair's height should allow your eyes to be level with the upper part of your monitor and your hands to be slightly lower than your elbows. BODY POSITION  Your feet should make contact with the floor. If this is not possible, use a foot rest.  Keep your ears over your shoulders. This will reduce stress on your neck and lower back. INCORRECT SITTING POSTURES  If you are feeling tired and unable to assume a healthy sitting posture, do not slouch or slump. This puts excessive strain on your back tissues, causing more damage and pain. Healthier options include:  Using more support, like a lumbar pillow.  Switching tasks to something that requires you to be upright or walking.  Talking a brief walk.  Lying down  to rest in a neutral-spine position. PROLONGED STANDING WHILE SLIGHTLY LEANING FORWARD  When completing a task that requires you to lean forward while standing in one place for a long time, place either foot up on a stationary 2-4 inch high object to help maintain the best posture. When both feet are on the ground, the lower back tends to lose its slight inward curve. If this curve flattens (or becomes too large), then the back and your other joints will experience too much stress, tire more quickly, and can cause pain. CORRECT STANDING POSTURES Proper standing posture should be assumed with all daily activities, even if they only take a few moments, like when brushing your teeth. As in sitting, your ears should fall over your shoulders and your shoulders should fall over your hips. You should keep a slight tension in your abdominal muscles to brace your spine. Your tailbone should point down to the ground, not behind your body, resulting in an over-extended swayback posture.  INCORRECT STANDING POSTURES  Common incorrect standing postures include a forward head, locked knees and/or an excessive swayback. WALKING Walk with an upright posture. Your ears, shoulders and hips should all line-up. PROLONGED ACTIVITY IN A FLEXED POSITION When completing a task that requires you to bend forward at your waist or lean over a low surface, try to find a way to stabilize 3 out of 4 of your limbs. You can place a hand or elbow on your thigh or rest a knee on the surface you are reaching across. This will provide you more stability so that your muscles do not fatigue as quickly. By keeping your knees relaxed, or slightly bent, you will also reduce stress across your lower back. CORRECT LIFTING TECHNIQUES DO :   Assume a wide stance. This will provide you more stability and the opportunity to get as close as possible to the object which you are lifting.  Tense your abdominals to brace your spine. Bend at the knees  and hips. Keeping your back locked in a neutral-spine position, lift using your leg muscles. Lift with your legs, keeping your back straight.  Test the weight of unknown objects before attempting to lift them.  Try to keep your elbows locked down at your sides in order get the best strength from your shoulders when carrying an object.  Always ask for help when lifting heavy or awkward objects. INCORRECT LIFTING TECHNIQUES DO NOT:   Lock your knees when lifting, even if it is a small object.  Bend and twist. Pivot at your feet or move your feet when needing to change directions.  Assume that you can safely pick up even a paper clip without proper posture. Document Released: 08/15/2005 Document Revised: 11/07/2011 Document Reviewed: 11/27/2008 [facility] Patient Information 2015 [place], [place]. This information is not intended to replace advice given to you by your health care provider. Make sure you discuss any questions you have with your health care provider.       I personally performed the services described in this documentation, which was scribed in my presence. The recorded information has been reviewed and considered, and addended by me as needed.

## 2015-03-20 NOTE — Patient Instructions (Addendum)
See work restrictions regarding back and neck, and hand. Faythe Ghee to apply heat or ice over affected areas in 10 minute intervals. If you are sedated from having to use a muscle relaxer during the day, then he should not operate machinery or drive.  Continue naproxen twice per day with food, change in muscle relaxant to Flexeril every 8 hours as needed (stop the methocarbamol. Do not combine this with new muscle relaxant).   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 However with the swelling and increased pain in that area since your injury, wear the brace until seen by orthopedics. I will refer you to them for your wrist, neck, and back.  Cervical Sprain A cervical sprain is an injury in the neck in which the strong, fibrous tissues (ligaments) that connect your neck bones stretch or tear. Cervical sprains can range from mild to severe. Severe cervical sprains can cause the neck vertebrae to be unstable. This can lead to damage of the spinal cord and can result in serious nervous system problems. The amount of time it takes for a cervical sprain to get better depends on the cause and extent of the injury. Most cervical sprains heal in 1 to 3 weeks. CAUSES  Severe cervical sprains may be caused by:   Contact sport injuries (such as from football, rugby, wrestling, hockey, auto racing, gymnastics, diving, martial arts, or boxing).   Motor vehicle collisions.   Whiplash injuries. This is an injury from a sudden forward and backward whipping movement of the head and neck.  Falls.  Mild cervical sprains may be caused by:   Being in an awkward position, such as while cradling a telephone between your ear and shoulder.   Sitting in a chair that does not offer proper support.   Working at a poorly [employer] station.   Looking up or down for long periods of time.  SYMPTOMS   Pain, soreness, stiffness, or  a burning sensation in the front, back, or sides of the neck. This discomfort may develop immediately after the injury or slowly, 24 hours or more after the injury.   Pain or tenderness directly in the middle of the back of the neck.   Shoulder or upper back pain.   Limited ability to move the neck.   Headache.   Dizziness.   Weakness, numbness, or tingling in the hands or arms.   Muscle spasms.   Difficulty swallowing or chewing.   Tenderness and swelling of the neck.  DIAGNOSIS  Most of the time your health care provider can diagnose a cervical sprain by taking your history and doing a physical exam. Your health care provider will ask about previous neck injuries and any known neck problems, such as arthritis in the neck. X-rays may be taken to find out if there are any other problems, such as with the bones of the neck. Other tests, such as a CT scan or MRI, may also be needed.  TREATMENT  Treatment depends on the severity of the cervical sprain. Mild sprains can be treated with rest, keeping the neck in place (immobilization), and pain medicines. Severe cervical sprains are immediately immobilized. Further treatment is done to help with pain, muscle spasms, and other symptoms and may include:  Medicines, such as pain relievers, numbing medicines, or muscle relaxants.   Physical therapy. This may involve stretching exercises, strengthening exercises, and posture training.  Exercises and improved posture can help stabilize the neck, strengthen muscles, and help stop symptoms from returning.  HOME CARE INSTRUCTIONS   Put ice on the injured area.   Put ice in a plastic bag.   Place a towel between your skin and the bag.   Leave the ice on for 15-20 minutes, 3-4 times a day.   If your injury was severe, you may have been given a cervical collar to wear. A cervical collar is a two-piece collar designed to keep your neck from moving while it heals.  Do not remove  the collar unless instructed by your health care provider.  If you have long hair, keep it outside of the collar.  Ask your health care provider before making any adjustments to your collar. Minor adjustments may be required over time to improve comfort and reduce pressure on your chin or on the back of your head.  Ifyou are allowed to remove the collar for cleaning or bathing, follow your health care provider's instructions on how to do so safely.  Keep your collar clean by wiping it with mild soap and water and drying it completely. If the collar you have been given includes removable pads, remove them every 1-2 days and hand wash them with soap and water. Allow them to air dry. They should be completely dry before you wear them in the collar.  If you are allowed to remove the collar for cleaning and bathing, wash and dry the skin of your neck. Check your skin for irritation or sores. If you see any, tell your health care provider.  Do not drive while wearing the collar.   Only take over-the-counter or prescription medicines for pain, discomfort, or fever as directed by your health care provider.   Keep all follow-up appointments as directed by your health care provider.   Keep all physical therapy appointments as directed by your health care provider.   Make any needed adjustments to your workstation to promote good posture.   Avoid positions and activities that make your symptoms worse.   Warm up and stretch before being active to help prevent problems.  SEEK MEDICAL CARE IF:   Your pain is not controlled with medicine.   You are unable to decrease your pain medicine over time as planned.   Your activity level is not improving as expected.  SEEK IMMEDIATE MEDICAL CARE IF:   You develop any bleeding.  You develop stomach upset.  You have signs of an allergic reaction to your medicine.   Your symptoms get worse.   You develop new, unexplained symptoms.    You have numbness, tingling, weakness, or paralysis in any part of your body.  MAKE SURE YOU:   Understand these instructions.  Will watch your condition.  Will get help right away if you are not doing well or get worse. Document Released: 06/12/2007 Document Revised: 08/20/2013 Document Reviewed: 02/20/2013 [facility] Patient Information 2015 [place], [place]. This information is not intended to replace advice given to you by your health care provider. Make sure you discuss any questions you have with your health care provider. Low Back Strain with Rehab A strain is an injury in which a tendon or muscle is torn. The muscles and tendons of the lower back are vulnerable to strains. However, these muscles and tendons are very strong and require a great force to be injured. Strains are classified into three categories. Grade 1 strains cause pain, but the tendon is not lengthened. Grade 2  strains include a lengthened ligament, due to the ligament being stretched or partially ruptured. With grade 2 strains there is still function, although the function may be decreased. Grade 3 strains involve a complete tear of the tendon or muscle, and function is usually impaired. SYMPTOMS   Pain in the lower back.  Pain that affects one side more than the other.  Pain that gets worse with movement and may be felt in the hip, buttocks, or back of the thigh.  Muscle spasms of the muscles in the back.  Swelling along the muscles of the back.  Loss of strength of the back muscles.  Crackling sound (crepitation) when the muscles are touched. CAUSES  Lower back strains occur when a force is placed on the muscles or tendons that is greater than they can handle. Common causes of injury include:  Prolonged overuse of the muscle-tendon units in the lower back, usually from incorrect posture.  A single violent injury or force applied to the back. RISK INCREASES WITH:  Sports that involve twisting forces on  the spine or a lot of bending at the waist (football, rugby, weightlifting, bowling, golf, tennis, speed skating, racquetball, swimming, running, gymnastics, diving).  Poor strength and flexibility.  Failure to warm up properly before activity.  Family history of lower back pain or disk disorders.  Previous back injury or surgery (especially fusion).  Poor posture with lifting, especially heavy objects.  Prolonged sitting, especially with poor posture. PREVENTION   Learn and use proper posture when sitting or lifting (maintain proper posture when sitting, lift using the knees and legs, not at the waist).  Warm up and stretch properly before activity.  Allow for adequate recovery between workouts.  Maintain physical fitness:  Strength, flexibility, and endurance.  Cardiovascular fitness. PROGNOSIS  If treated properly, lower back strains usually heal within 6 weeks. RELATED COMPLICATIONS   Recurring symptoms, resulting in a chronic problem.  Chronic inflammation, scarring, and partial muscle-tendon tear.  Delayed healing or resolution of symptoms.  Prolonged disability. TREATMENT  Treatment first involves the use of ice and medicine, to reduce pain and inflammation. The use of strengthening and stretching exercises may help reduce pain with activity. These exercises may be performed at home or with a therapist. Severe injuries may require referral to a therapist for further evaluation and treatment, such as ultrasound. Your caregiver may advise that you wear a back brace or corset, to help reduce pain and discomfort. Often, prolonged bed rest results in greater harm then benefit. Corticosteroid injections may be recommended. However, these should be reserved for the most serious cases. It is important to avoid using your back when lifting objects. At night, sleep on your back on a firm mattress with a pillow placed under your knees. If non-surgical treatment is unsuccessful,  surgery may be needed.  MEDICATION   If pain medicine is needed, nonsteroidal anti-inflammatory medicines (aspirin and ibuprofen), or other minor pain relievers (acetaminophen), are often advised.  Do not take pain medicine for 7 days before surgery.  Prescription pain relievers may be given, if your caregiver thinks they are needed. Use only as directed and only as much as you need.  Ointments applied to the skin may be helpful.  Corticosteroid injections may be given by your caregiver. These injections should be reserved for the most serious cases, because they may only be given a certain number of times. HEAT AND COLD  Cold treatment (icing) should be applied for 10 to 15 minutes every 2  to 3 hours for inflammation and pain, and immediately after activity that aggravates your symptoms. Use ice packs or an ice massage.  Heat treatment may be used before performing stretching and strengthening activities prescribed by your caregiver, physical therapist, or athletic trainer. Use a heat pack or a warm water soak. SEEK MEDICAL CARE IF:   Symptoms get worse or do not improve in 2 to 4 weeks, despite treatment.  You develop numbness, weakness, or loss of bowel or bladder function.  New, unexplained symptoms develop. (Drugs used in treatment may produce side effects.) EXERCISES  RANGE OF MOTION (ROM) AND STRETCHING EXERCISES - Low Back Strain Most people with lower back pain will find that their symptoms get worse with excessive bending forward (flexion) or arching at the lower back (extension). The exercises which will help resolve your symptoms will focus on the opposite motion.  Your physician, physical therapist or athletic trainer will help you determine which exercises will be most helpful to resolve your lower back pain. Do not complete any exercises without first consulting with your caregiver. Discontinue any exercises which make your symptoms worse until you speak to your caregiver.   If you have pain, numbness or tingling which travels down into your buttocks, leg or foot, the goal of the therapy is for these symptoms to move closer to your back and eventually resolve. Sometimes, these leg symptoms will get better, but your lower back pain may worsen. This is typically an indication of progress in your rehabilitation. Be very alert to any changes in your symptoms and the activities in which you participated in the 24 hours prior to the change. Sharing this information with your caregiver will allow him/her to most efficiently treat your condition.  These exercises may help you when beginning to rehabilitate your injury. Your symptoms may resolve with or without further involvement from your physician, physical therapist or athletic trainer. While completing these exercises, remember:  Restoring tissue flexibility helps normal motion to return to the joints. This allows healthier, less painful movement and activity.  An effective stretch should be held for at least 30 seconds.  A stretch should never be painful. You should only feel a gentle lengthening or release in the stretched tissue. FLEXION RANGE OF MOTION AND STRETCHING EXERCISES: STRETCH - Flexion, Single Knee to Chest   Lie on a firm bed or floor with both legs extended in front of you.  Keeping one leg in contact with the floor, bring your opposite knee to your chest. Hold your leg in place by either grabbing behind your thigh or at your knee.  Pull until you feel a gentle stretch in your lower back. Hold __________ seconds.  Slowly release your grasp and repeat the exercise with the opposite side. Repeat __________ times. Complete this exercise __________ times per day.  STRETCH - Flexion, Double Knee to Chest   Lie on a firm bed or floor with both legs extended in front of you.  Keeping one leg in contact with the floor, bring your opposite knee to your chest.  Tense your stomach muscles to support your  back and then lift your other knee to your chest. Hold your legs in place by either grabbing behind your thighs or at your knees.  Pull both knees toward your chest until you feel a gentle stretch in your lower back. Hold __________ seconds.  Tense your stomach muscles and slowly return one leg at a time to the floor. Repeat __________ times. Complete this exercise  __________ times per day.  STRETCH - Low Trunk Rotation  Lie on a firm bed or floor. Keeping your legs in front of you, bend your knees so they are both pointed toward the ceiling and your feet are flat on the floor.  Extend your arms out to the side. This will stabilize your upper body by keeping your shoulders in contact with the floor.  Gently and slowly drop both knees together to one side until you feel a gentle stretch in your lower back. Hold for __________ seconds.  Tense your stomach muscles to support your lower back as you bring your knees back to the starting position. Repeat the exercise to the other side. Repeat __________ times. Complete this exercise __________ times per day  EXTENSION RANGE OF MOTION AND FLEXIBILITY EXERCISES: STRETCH - Extension, Prone on Elbows   Lie on your stomach on the floor, a bed will be too soft. Place your palms about shoulder width apart and at the height of your head.  Place your elbows under your shoulders. If this is too painful, stack pillows under your chest.  Allow your body to relax so that your hips drop lower and make contact more completely with the floor.  Hold this position for __________ seconds.  Slowly return to lying flat on the floor. Repeat __________ times. Complete this exercise __________ times per day.  RANGE OF MOTION - Extension, Prone Press Ups  Lie on your stomach on the floor, a bed will be too soft. Place your palms about shoulder width apart and at the height of your head.  Keeping your back as relaxed as possible, slowly straighten your elbows while  keeping your hips on the floor. You may adjust the placement of your hands to maximize your comfort. As you gain motion, your hands will come more underneath your shoulders.  Hold this position __________ seconds.  Slowly return to lying flat on the floor. Repeat __________ times. Complete this exercise __________ times per day.  RANGE OF MOTION- Quadruped, Neutral Spine   Assume a hands and knees position on a firm surface. Keep your hands under your shoulders and your knees under your hips. You may place padding under your knees for comfort.  Drop your head and point your tail bone toward the ground below you. This will round out your lower back like an angry cat. Hold this position for __________ seconds.  Slowly lift your head and release your tail bone so that your back sags into a large arch, like an old horse.  Hold this position for __________ seconds.  Repeat this until you feel limber in your lower back.  Now, find your "sweet spot." This will be the most comfortable position somewhere between the two previous positions. This is your neutral spine. Once you have found this position, tense your stomach muscles to support your lower back.  Hold this position for __________ seconds. Repeat __________ times. Complete this exercise __________ times per day.  STRENGTHENING EXERCISES - Low Back Strain These exercises may help you when beginning to rehabilitate your injury. These exercises should be done near your "sweet spot." This is the neutral, low-back arch, somewhere between fully rounded and fully arched, that is your least painful position. When performed in this safe range of motion, these exercises can be used for people who have either a flexion or extension based injury. These exercises may resolve your symptoms with or without further involvement from your physician, physical therapist or athletic trainer. While completing  these exercises, remember:   Muscles can gain both the  endurance and the strength needed for everyday activities through controlled exercises.  Complete these exercises as instructed by your physician, physical therapist or athletic trainer. Increase the resistance and repetitions only as guided.  You may experience muscle soreness or fatigue, but the pain or discomfort you are trying to eliminate should never worsen during these exercises. If this pain does worsen, stop and make certain you are following the directions exactly. If the pain is still present after adjustments, discontinue the exercise until you can discuss the trouble with your caregiver. STRENGTHENING - Deep Abdominals, Pelvic Tilt  Lie on a firm bed or floor. Keeping your legs in front of you, bend your knees so they are both pointed toward the ceiling and your feet are flat on the floor.  Tense your lower abdominal muscles to press your lower back into the floor. This motion will rotate your pelvis so that your tail bone is scooping upwards rather than pointing at your feet or into the floor.  With a gentle tension and even breathing, hold this position for __________ seconds. Repeat __________ times. Complete this exercise __________ times per day.  STRENGTHENING - Abdominals, Crunches   Lie on a firm bed or floor. Keeping your legs in front of you, bend your knees so they are both pointed toward the ceiling and your feet are flat on the floor. Cross your arms over your chest.  Slightly tip your chin down without bending your neck.  Tense your abdominals and slowly lift your trunk high enough to just clear your shoulder blades. Lifting higher can put excessive stress on the lower back and does not further strengthen your abdominal muscles.  Control your return to the starting position. Repeat __________ times. Complete this exercise __________ times per day.  STRENGTHENING - Quadruped, Opposite UE/LE Lift   Assume a hands and knees position on a firm surface. Keep your hands  under your shoulders and your knees under your hips. You may place padding under your knees for comfort.  Find your neutral spine and gently tense your abdominal muscles so that you can maintain this position. Your shoulders and hips should form a rectangle that is parallel with the floor and is not twisted.  Keeping your trunk steady, lift your right hand no higher than your shoulder and then your left leg no higher than your hip. Make sure you are not holding your breath. Hold this position __________ seconds.  Continuing to keep your abdominal muscles tense and your back steady, slowly return to your starting position. Repeat with the opposite arm and leg. Repeat __________ times. Complete this exercise __________ times per day.  STRENGTHENING - Lower Abdominals, Double Knee Lift  Lie on a firm bed or floor. Keeping your legs in front of you, bend your knees so they are both pointed toward the ceiling and your feet are flat on the floor.  Tense your abdominal muscles to brace your lower back and slowly lift both of your knees until they come over your hips. Be certain not to hold your breath.  Hold __________ seconds. Using your abdominal muscles, return to the starting position in a slow and controlled manner. Repeat __________ times. Complete this exercise __________ times per day.  POSTURE AND BODY MECHANICS CONSIDERATIONS - Low Back Strain Keeping correct posture when sitting, standing or completing your activities will reduce the stress put on different body tissues, allowing injured tissues a chance to heal  and limiting painful experiences. The following are general guidelines for improved posture. Your physician or physical therapist will provide you with any instructions specific to your needs. While reading these guidelines, remember:  The exercises prescribed by your provider will help you have the flexibility and strength to maintain correct postures.  The correct posture provides  the best environment for your joints to work. All of your joints have less wear and tear when properly supported by a spine with good posture. This means you will experience a healthier, less painful body.  Correct posture must be practiced with all of your activities, especially prolonged sitting and standing. Correct posture is as important when doing repetitive low-stress activities (typing) as it is when doing a single heavy-load activity (lifting). RESTING POSITIONS Consider which positions are most painful for you when choosing a resting position. If you have pain with flexion-based activities (sitting, bending, stooping, squatting), choose a position that allows you to rest in a less flexed posture. You would want to avoid curling into a fetal position on your side. If your pain worsens with extension-based activities (prolonged standing, working overhead), avoid resting in an extended position such as sleeping on your stomach. Most people will find more comfort when they rest with their spine in a more neutral position, neither too rounded nor too arched. Lying on a non-sagging bed on your side with a pillow between your knees, or on your back with a pillow under your knees will often provide some relief. Keep in mind, being in any one position for a prolonged period of time, no matter how correct your posture, can still lead to stiffness. PROPER SITTING POSTURE In order to minimize stress and discomfort on your spine, you must sit with correct posture. Sitting with good posture should be effortless for a healthy body. Returning to good posture is a gradual process. Many people can work toward this most comfortably by using various supports until they have the flexibility and strength to maintain this posture on their own. When sitting with proper posture, your ears will fall over your shoulders and your shoulders will fall over your hips. You should use the back of the chair to support your upper  back. Your lower back will be in a neutral position, just slightly arched. You may place a small pillow or folded towel at the base of your lower back for support.  When working at a desk, create an environment that supports good, upright posture. Without extra support, muscles tire, which leads to excessive strain on joints and other tissues. Keep these recommendations in mind: CHAIR:  A chair should be able to slide under your desk when your back makes contact with the back of the chair. This allows you to work closely.  The chair's height should allow your eyes to be level with the upper part of your monitor and your hands to be slightly lower than your elbows. BODY POSITION  Your feet should make contact with the floor. If this is not possible, use a foot rest.  Keep your ears over your shoulders. This will reduce stress on your neck and lower back. INCORRECT SITTING POSTURES  If you are feeling tired and unable to assume a healthy sitting posture, do not slouch or slump. This puts excessive strain on your back tissues, causing more damage and pain. Healthier options include:  Using more support, like a lumbar pillow.  Switching tasks to something that requires you to be upright or walking.  Talking a  brief walk.  Lying down to rest in a neutral-spine position. PROLONGED STANDING WHILE SLIGHTLY LEANING FORWARD  When completing a task that requires you to lean forward while standing in one place for a long time, place either foot up on a stationary 2-4 inch high object to help maintain the best posture. When both feet are on the ground, the lower back tends to lose its slight inward curve. If this curve flattens (or becomes too large), then the back and your other joints will experience too much stress, tire more quickly, and can cause pain. CORRECT STANDING POSTURES Proper standing posture should be assumed with all daily activities, even if they only take a few moments, like when  brushing your teeth. As in sitting, your ears should fall over your shoulders and your shoulders should fall over your hips. You should keep a slight tension in your abdominal muscles to brace your spine. Your tailbone should point down to the ground, not behind your body, resulting in an over-extended swayback posture.  INCORRECT STANDING POSTURES  Common incorrect standing postures include a forward head, locked knees and/or an excessive swayback. WALKING Walk with an upright posture. Your ears, shoulders and hips should all line-up. PROLONGED ACTIVITY IN A FLEXED POSITION When completing a task that requires you to bend forward at your waist or lean over a low surface, try to find a way to stabilize 3 out of 4 of your limbs. You can place a hand or elbow on your thigh or rest a knee on the surface you are reaching across. This will provide you more stability so that your muscles do not fatigue as quickly. By keeping your knees relaxed, or slightly bent, you will also reduce stress across your lower back. CORRECT LIFTING TECHNIQUES DO :   Assume a wide stance. This will provide you more stability and the opportunity to get as close as possible to the object which you are lifting.  Tense your abdominals to brace your spine. Bend at the knees and hips. Keeping your back locked in a neutral-spine position, lift using your leg muscles. Lift with your legs, keeping your back straight.  Test the weight of unknown objects before attempting to lift them.  Try to keep your elbows locked down at your sides in order get the best strength from your shoulders when carrying an object.  Always ask for help when lifting heavy or awkward objects. INCORRECT LIFTING TECHNIQUES DO NOT:   Lock your knees when lifting, even if it is a small object.  Bend and twist. Pivot at your feet or move your feet when needing to change directions.  Assume that you can safely pick up even a paper clip without proper  posture. Document Released: 08/15/2005 Document Revised: 11/07/2011 Document Reviewed: 11/27/2008 [facility] Patient Information 2015 [place], [place]. This information is not intended to replace advice given to you by your health care provider. Make sure you discuss any questions you have with your health care provider.

## 2015-06-19 ENCOUNTER — Encounter (HOSPITAL_COMMUNITY): Payer: Self-pay | Admitting: Emergency Medicine

## 2015-06-19 ENCOUNTER — Emergency Department (HOSPITAL_COMMUNITY)
Admission: EM | Admit: 2015-06-19 | Discharge: 2015-06-20 | Disposition: A | Discharge status: Home / Self Care | Payer: PRIVATE HEALTH INSURANCE | Attending: Emergency Medicine | Admitting: Emergency Medicine

## 2015-06-19 DIAGNOSIS — R109 Unspecified abdominal pain: Secondary | ICD-10-CM

## 2015-06-19 DIAGNOSIS — Z8639 Personal history of other endocrine, nutritional and metabolic disease: Secondary | ICD-10-CM | POA: Diagnosis not present

## 2015-06-19 DIAGNOSIS — Z79899 Other long term (current) drug therapy: Secondary | ICD-10-CM | POA: Diagnosis not present

## 2015-06-19 DIAGNOSIS — R112 Nausea with vomiting, unspecified: Secondary | ICD-10-CM

## 2015-06-19 DIAGNOSIS — I10 Essential (primary) hypertension: Secondary | ICD-10-CM | POA: Insufficient documentation

## 2015-06-19 DIAGNOSIS — Z720 Tobacco use: Secondary | ICD-10-CM | POA: Insufficient documentation

## 2015-06-19 DIAGNOSIS — K529 Noninfective gastroenteritis and colitis, unspecified: Secondary | ICD-10-CM | POA: Diagnosis not present

## 2015-06-19 DIAGNOSIS — Z791 Long term (current) use of non-steroidal anti-inflammatories (NSAID): Secondary | ICD-10-CM | POA: Insufficient documentation

## 2015-06-19 DIAGNOSIS — R1084 Generalized abdominal pain: Secondary | ICD-10-CM | POA: Diagnosis present

## 2015-06-19 LAB — CBC WITH DIFFERENTIAL/PLATELET
Basophils Absolute: 0 10*3/uL (ref 0.0–0.1)
Basophils Relative: 0 %
Eosinophils Absolute: 0.1 10*3/uL (ref 0.0–0.7)
Eosinophils Relative: 1 %
HCT: 46.2 % (ref 39.0–52.0)
HEMOGLOBIN: 16.1 g/dL (ref 13.0–17.0)
LYMPHS PCT: 9 %
Lymphs Abs: 0.9 10*3/uL (ref 0.7–4.0)
MCH: 32.5 pg (ref 26.0–34.0)
MCHC: 34.8 g/dL (ref 30.0–36.0)
MCV: 93.1 fL (ref 78.0–100.0)
Monocytes Absolute: 0.4 10*3/uL (ref 0.1–1.0)
Monocytes Relative: 4 %
NEUTROS PCT: 86 %
Neutro Abs: 8.9 10*3/uL — ABNORMAL HIGH (ref 1.7–7.7)
Platelets: 175 10*3/uL (ref 150–400)
RBC: 4.96 MIL/uL (ref 4.22–5.81)
RDW: 14.8 % (ref 11.5–15.5)
WBC: 10.3 10*3/uL (ref 4.0–10.5)

## 2015-06-19 LAB — COMPREHENSIVE METABOLIC PANEL
ALK PHOS: 40 U/L (ref 38–126)
ALT: 59 U/L (ref 17–63)
AST: 58 U/L — ABNORMAL HIGH (ref 15–41)
Albumin: 4.2 g/dL (ref 3.5–5.0)
Anion gap: 11 (ref 5–15)
BUN: 9 mg/dL (ref 6–20)
CALCIUM: 9.6 mg/dL (ref 8.9–10.3)
CO2: 29 mmol/L (ref 22–32)
CREATININE: 0.67 mg/dL (ref 0.61–1.24)
Chloride: 103 mmol/L (ref 101–111)
GFR calc non Af Amer: >60 mL/min (ref >60)
Glucose, Bld: 122 mg/dL — ABNORMAL HIGH (ref 65–99)
Potassium: 3.4 mmol/L — ABNORMAL LOW (ref 3.5–5.1)
Sodium: 143 mmol/L (ref 135–145)
Total Bilirubin: 0.7 mg/dL (ref 0.3–1.2)
Total Protein: 7.5 g/dL (ref 6.5–8.1)

## 2015-06-19 LAB — LIPASE, BLOOD: Lipase: 36 U/L (ref 11–51)

## 2015-06-19 MED ORDER — HYDROMORPHONE HCL 1 MG/ML IJ SOLN
1.0000 mg | INTRAMUSCULAR | Status: DC | PRN
  Filled 2015-06-19: qty 1

## 2015-06-19 MED ORDER — SODIUM CHLORIDE 0.9 % IV BOLUS (SEPSIS)
1000.0000 mL | Freq: Once | INTRAVENOUS | Status: AC
  Administered 2015-06-19: 1000 mL via INTRAVENOUS

## 2015-06-19 MED ORDER — ONDANSETRON HCL 4 MG/2ML IJ SOLN
4.0000 mg | Freq: Once | INTRAMUSCULAR | Status: AC
  Administered 2015-06-19: 4 mg via INTRAVENOUS
  Filled 2015-06-19: qty 2

## 2015-06-19 NOTE — ED Provider Notes (Signed)
CSN: [id number]     Arrival date & time 06/19/15  2119 History   First MD Initiated Contact with Patient 06/19/15 2203     Chief Complaint  Patient presents with  . Abdominal Pain  . Emesis    Patient is a 54 y.o. male presenting with abdominal pain and vomiting. The history is provided by the patient.  Abdominal Pain Pain location:  Generalized Pain quality comment:  Severe pain Duration:  2 hours Timing:  Intermittent Chronicity:  New Relieved by:  Nothing Worsened by:  Nothing tried Associated symptoms: vomiting   Associated symptoms: no cough, no diarrhea, no dysuria, no fatigue, no fever and no hematuria   Emesis Associated symptoms: abdominal pain   Associated symptoms: no diarrhea     Past Medical History  Diagnosis Date  . Hypertension   . GERD (gastroesophageal reflux disease)   . Hyperlipemia    Past Surgical History  Procedure Laterality Date  . None     Family History  Problem Relation Age of Onset  . Stroke Mother   . Stroke Father   . Hypertension Father   . Cancer Neg Hx   . Alcohol abuse Neg Hx   . Asthma Neg Hx   . COPD Neg Hx   . Depression Neg Hx   . Diabetes Neg Hx   . Drug abuse Neg Hx   . Early death Neg Hx   . Hearing loss Neg Hx   . Heart disease Neg Hx   . Hyperlipidemia Neg Hx   . Kidney disease Neg Hx    Social History  Substance Use Topics  . Smoking status: Current Every Day Smoker -- 0.25 packs/day for 25 years    Types: Cigarettes  . Smokeless tobacco: Never Used  . Alcohol Use: 8.4 oz/week    14 Cans of beer per week    Review of Systems  Constitutional: Negative for fever and fatigue.  Respiratory: Negative for cough.   Gastrointestinal: Positive for vomiting and abdominal pain. Negative for diarrhea.  Genitourinary: Negative for dysuria and hematuria.  All other systems reviewed and are negative.     Allergies  Review of patient's allergies indicates no known allergies.  Home Medications   Prior to  Admission medications   Medication Sig Start Date End Date Taking? Authorizing Provider  amLODipine (NORVASC) 5 MG tablet Take 5 mg by mouth daily. 01/09/15 01/09/16 Yes Historical Provider, MD  cyclobenzaprine (FLEXERIL) 5 MG tablet 1 pill by mouth up to every 8 hours as needed. Start with one pill by mouth each bedtime as needed due to sedation 03/20/15  Yes Wendie Agreste, MD  lisinopril (PRINIVIL,ZESTRIL) 10 MG tablet Take 10 mg by mouth daily. 03/13/15  Yes Historical Provider, MD  naproxen (NAPROSYN) 500 MG tablet Take 1 tablet (500 mg total) by mouth 2 (two) times daily. 03/13/15  Yes Heather Laisure, PA-C  sildenafil (REVATIO) 20 MG tablet Take 2-5 tablets by mouth as needed. sex 01/09/15  Yes Historical Provider, MD   BP 148/101 mmHg  Pulse 91  Temp(Src) 97.5 F (36.4 C) (Oral)  Resp 16  Ht 5\' 7"  (1.702 m)  Wt 188 lb (85.276 kg)  BMI 29.44 kg/m2  SpO2 93% Physical Exam  Constitutional: He appears well-developed and well-nourished. No distress.  HENT:  Head: Normocephalic and atraumatic.  Right Ear: External ear normal.  Left Ear: External ear normal.  Eyes: Conjunctivae are normal. Right eye exhibits no discharge. Left eye exhibits no discharge. No scleral icterus.  Neck: Neck supple. No tracheal deviation present.  Cardiovascular: Normal rate, regular rhythm and intact distal pulses.   Pulmonary/Chest: Effort normal and breath sounds normal. No stridor. No respiratory distress. He has no wheezes. He has no rales.  Abdominal: Soft. Bowel sounds are normal. He exhibits no distension. There is tenderness in the right upper quadrant and epigastric area. There is no rebound and no guarding.  Musculoskeletal: He exhibits no edema or tenderness.  Neurological: He is alert. He has normal strength. No cranial nerve deficit (no facial droop, extraocular movements intact, no slurred speech) or sensory deficit. He exhibits normal muscle tone. He displays no seizure activity. Coordination  normal.  Skin: Skin is warm and dry. No rash noted.  Psychiatric: He has a normal mood and affect.  Nursing note and vitals reviewed.   ED Course  Procedures (including critical care time) Labs Review Labs Reviewed  CBC WITH DIFFERENTIAL/PLATELET - Abnormal; Notable for the following:    Neutro Abs 8.9 (*)    All other components within normal limits  COMPREHENSIVE METABOLIC PANEL - Abnormal; Notable for the following:    Potassium 3.4 (*)    Glucose, Bld 122 (*)    AST 58 (*)    All other components within normal limits  LIPASE, BLOOD  URINALYSIS, ROUTINE W REFLEX MICROSCOPIC (NOT AT [facility])    I have personally reviewed and evaluated these lab results as part of my medical decision-making.    MDM    Sx returned while he was in the ED.  Considering his degree of pain will Ct to evaluate further.   UA is pending.  Will turn case over to oncoming MD.    Dorie Rank, MD 06/20/15 [phone]

## 2015-06-19 NOTE — ED Notes (Signed)
Pt reports abdominal pain and emesis starting two hours ago. Denies hematemesis. Says this does not feel like s/s of GERD. No active vomiting in triage. Denies localized pain in abdomen. Ambulatory with steady gait. A&Ox4. RR even/unlabored.

## 2015-06-19 NOTE — ED Notes (Signed)
Pt states he is ready for discharge.  

## 2015-06-19 NOTE — ED Notes (Signed)
Informed the pt that a urine specimen is needed. 

## 2015-06-20 ENCOUNTER — Emergency Department (HOSPITAL_COMMUNITY): Payer: PRIVATE HEALTH INSURANCE

## 2015-06-20 LAB — URINALYSIS, ROUTINE W REFLEX MICROSCOPIC
BILIRUBIN URINE: NEGATIVE
GLUCOSE, UA: NEGATIVE mg/dL
HGB URINE DIPSTICK: NEGATIVE
Ketones, ur: NEGATIVE mg/dL
Leukocytes, UA: NEGATIVE
Nitrite: NEGATIVE
PROTEIN: NEGATIVE mg/dL
Specific Gravity, Urine: 1.021 (ref 1.005–1.030)
Urobilinogen, UA: 0.2 mg/dL (ref 0.0–1.0)
pH: 6 (ref 5.0–8.0)

## 2015-06-20 MED ORDER — METRONIDAZOLE 500 MG PO TABS
500.0000 mg | ORAL_TABLET | Freq: Once | ORAL | Status: AC
  Administered 2015-06-20: 500 mg via ORAL
  Filled 2015-06-20: qty 1

## 2015-06-20 MED ORDER — IOHEXOL 300 MG/ML  SOLN
100.0000 mL | Freq: Once | INTRAMUSCULAR | Status: AC | PRN
  Administered 2015-06-20: 100 mL via INTRAVENOUS

## 2015-06-20 MED ORDER — TRAMADOL HCL 50 MG PO TABS: 50.0000 mg | ORAL_TABLET | Freq: Four times a day (QID) | ORAL | Status: DC | PRN

## 2015-06-20 MED ORDER — METRONIDAZOLE 500 MG PO TABS: 500.0000 mg | ORAL_TABLET | Freq: Three times a day (TID) | ORAL | Status: DC

## 2015-06-20 MED ORDER — IOHEXOL 300 MG/ML  SOLN
50.0000 mL | Freq: Once | INTRAMUSCULAR | Status: AC | PRN
  Administered 2015-06-20: 50 mL via ORAL

## 2015-06-20 MED ORDER — CIPROFLOXACIN HCL 500 MG PO TABS: 500.0000 mg | ORAL_TABLET | Freq: Two times a day (BID) | ORAL | Status: DC

## 2015-06-20 MED ORDER — CIPROFLOXACIN HCL 500 MG PO TABS
500.0000 mg | ORAL_TABLET | Freq: Once | ORAL | Status: AC
  Administered 2015-06-20: 500 mg via ORAL
  Filled 2015-06-20: qty 1

## 2015-06-20 NOTE — ED Notes (Signed)
Patient transported to CT 

## 2015-06-20 NOTE — ED Provider Notes (Signed)
Patient initially seen and evaluated by Dr. Tomi Bamberger, signed out to me to evaluate results of CT scan. CT scan shows area of enteritis which may account for his pain and vomiting. Patient is resting comfortably now and in no acute distress. He is discharged with prescriptions for ciprofloxacin, metronidazole, and tramadol. Follow-up with PCP, return precautions given.  Results for orders placed or performed during the hospital encounter of 06/19/15  CBC with Differential  Result Value Ref Range   WBC 10.3 4.0 - 10.5 K/uL   RBC 4.96 4.22 - 5.81 MIL/uL   Hemoglobin 16.1 13.0 - 17.0 g/dL   HCT 46.2 39.0 - 52.0 %   MCV 93.1 78.0 - 100.0 fL   MCH 32.5 26.0 - 34.0 pg   MCHC 34.8 30.0 - 36.0 g/dL   RDW 14.8 11.5 - 15.5 %   Platelets 175 150 - 400 K/uL   Neutrophils Relative % 86 %   Neutro Abs 8.9 (H) 1.7 - 7.7 K/uL   Lymphocytes Relative 9 %   Lymphs Abs 0.9 0.7 - 4.0 K/uL   Monocytes Relative 4 %   Monocytes Absolute 0.4 0.1 - 1.0 K/uL   Eosinophils Relative 1 %   Eosinophils Absolute 0.1 0.0 - 0.7 K/uL   Basophils Relative 0 %   Basophils Absolute 0.0 0.0 - 0.1 K/uL  Comprehensive metabolic panel  Result Value Ref Range   Sodium 143 135 - 145 mmol/L   Potassium 3.4 (L) 3.5 - 5.1 mmol/L   Chloride 103 101 - 111 mmol/L   CO2 29 22 - 32 mmol/L   Glucose, Bld 122 (H) 65 - 99 mg/dL   BUN 9 6 - 20 mg/dL   Creatinine, Ser 0.67 0.61 - 1.24 mg/dL   Calcium 9.6 8.9 - 10.3 mg/dL   Total Protein 7.5 6.5 - 8.1 g/dL   Albumin 4.2 3.5 - 5.0 g/dL   AST 58 (H) 15 - 41 U/L   ALT 59 17 - 63 U/L   Alkaline Phosphatase 40 38 - 126 U/L   Total Bilirubin 0.7 0.3 - 1.2 mg/dL   GFR calc non Af Amer >60 >60 mL/min   GFR calc Af Amer >60 >60 mL/min   Anion gap 11 5 - 15  Lipase, blood  Result Value Ref Range   Lipase 36 11 - 51 U/L  Urinalysis, Routine w reflex microscopic (not at [facility])  Result Value Ref Range   Color, Urine YELLOW YELLOW   APPearance CLEAR CLEAR   Specific Gravity, Urine 1.021 1.005  - 1.030   pH 6.0 5.0 - 8.0   Glucose, UA NEGATIVE NEGATIVE mg/dL   Hgb urine dipstick NEGATIVE NEGATIVE   Bilirubin Urine NEGATIVE NEGATIVE   Ketones, ur NEGATIVE NEGATIVE mg/dL   Protein, ur NEGATIVE NEGATIVE mg/dL   Urobilinogen, UA 0.2 0.0 - 1.0 mg/dL   Nitrite NEGATIVE NEGATIVE   Leukocytes, UA NEGATIVE NEGATIVE   Ct Abdomen Pelvis W Contrast  06/20/2015  CLINICAL DATA:  Abdominal pain and emesis for 2 hours. EXAM: CT ABDOMEN AND PELVIS WITH CONTRAST TECHNIQUE: Multidetector CT imaging of the abdomen and pelvis was performed using the standard protocol following bolus administration of intravenous contrast. CONTRAST:  35mL OMNIPAQUE IOHEXOL 300 MG/ML SOLN, 118mL OMNIPAQUE IOHEXOL 300 MG/ML SOLN COMPARISON:  None. FINDINGS: Lower chest:  The included lung bases are clear. Liver: Decreased density consistent with steatosis. A 7 mm hypodensity within segment 6 is too small to accurately characterize. Trace perihepatic ascites. Hepatobiliary: Gallbladder is decompressed.  No  biliary dilatation. Pancreas: Normal. Spleen: Normal. Adrenal glands: No nodule. Kidneys: Symmetric renal enhancement and excretion. No hydronephrosis. No localizing renal abnormality. Stomach/Bowel: Stomach physiologically distended. There are fluid-filled normal caliber small bowel loops in the left mid abdomen with small bowel thickening involving a short segment. Mild adjacent mesenteric edema. Small volume of stool throughout the colon without colonic wall thickening. Mild diverticulosis of the descending colon without diverticulitis. The appendix is normal. Vascular/Lymphatic: No retroperitoneal adenopathy. Abdominal aorta is normal in caliber. Minimal atherosclerosis without aneurysm. Reproductive: Prostate gland normal in size. Bladder: Physiologically distended without wall thickening. Other: No free air or intra-abdominal fluid collection. Small amount of perihepatic, right pericolic gutter, and pelvic free fluid, likely  reactive. Musculoskeletal: There are no acute or suspicious osseous abnormalities. Degenerative change in the lumbar spine. IMPRESSION: 1. Short segment small bowel thickening with adjacent inflammation and mesenteric edema. Findings suggest nonspecific enteritis, may be infectious or inflammatory. Small amount free fluid is likely reactive. 2. Incidental findings include diverticulosis without diverticulitis and hepatic steatosis. Electronically Signed   By: Jeb Levering M.D.   On: 06/20/2015 02:14    Images viewed by me.   Delora Fuel, MD 73/56/70 1410

## 2015-06-20 NOTE — ED Notes (Signed)
MD at bedside. 

## 2015-06-20 NOTE — Discharge Instructions (Signed)
Urinary CT scan shows probable infection in the small bowel. You are being started on antibiotics to treat this. Return to the ED if pain is getting worse or if you start running a high fever.  Abdominal Pain, Adult Many things can cause abdominal pain. Usually, abdominal pain is not caused by a disease and will improve without treatment. It can often be observed and treated at home. Your health care provider will do a physical exam and possibly order blood tests and X-rays to help determine the seriousness of your pain. However, in many cases, more time must pass before a clear cause of the pain can be found. Before that point, your health care provider may not know if you need more testing or further treatment. HOME CARE INSTRUCTIONS Monitor your abdominal pain for any changes. The following actions may help to alleviate any discomfort you are experiencing:  Only take over-the-counter or prescription medicines as directed by your health care provider.  Do not take laxatives unless directed to do so by your health care provider.  Try a clear liquid diet (broth, tea, or water) as directed by your health care provider. Slowly move to a bland diet as tolerated. SEEK MEDICAL CARE IF:  You have unexplained abdominal pain.  You have abdominal pain associated with nausea or diarrhea.  You have pain when you urinate or have a bowel movement.  You experience abdominal pain that wakes you in the night.  You have abdominal pain that is worsened or improved by eating food.  You have abdominal pain that is worsened with eating fatty foods.  You have a fever. SEEK IMMEDIATE MEDICAL CARE IF:  Your pain does not go away within 2 hours.  You keep throwing up (vomiting).  Your pain is felt only in portions of the abdomen, such as the right side or the left lower portion of the abdomen.  You pass bloody or black tarry stools. MAKE SURE YOU:  Understand these instructions.  Will watch your  condition.  Will get help right away if you are not doing well or get worse.   This information is not intended to replace advice given to you by your health care provider. Make sure you discuss any questions you have with your health care provider.   Document Released: 05/25/2005 Document Revised: 05/06/2015 Document Reviewed: 04/24/2013 Elsevier Interactive Patient Education 2016 [employer].  Nausea and Vomiting Nausea is a sick feeling that often comes before throwing up (vomiting). Vomiting is a reflex where stomach contents come out of your mouth. Vomiting can cause severe loss of body fluids (dehydration). Children and elderly adults can become dehydrated quickly, especially if they also have diarrhea. Nausea and vomiting are symptoms of a condition or disease. It is important to find the cause of your symptoms. CAUSES   Direct irritation of the stomach lining. This irritation can result from increased acid production (gastroesophageal reflux disease), infection, food poisoning, taking certain medicines (such as nonsteroidal anti-inflammatory drugs), alcohol use, or tobacco use.  Signals from the brain.These signals could be caused by a headache, heat exposure, an inner ear disturbance, increased pressure in the brain from injury, infection, a tumor, or a concussion, pain, emotional stimulus, or metabolic problems.  An obstruction in the gastrointestinal tract (bowel obstruction).  Illnesses such as diabetes, hepatitis, gallbladder problems, appendicitis, kidney problems, cancer, sepsis, atypical symptoms of a heart attack, or eating disorders.  Medical treatments such as chemotherapy and radiation.  Receiving medicine that makes you sleep (general anesthetic)  during surgery. DIAGNOSIS Your caregiver may ask for tests to be done if the problems do not improve after a few days. Tests may also be done if symptoms are severe or if the reason for the nausea and vomiting is not clear.  Tests may include:  Urine tests.  Blood tests.  Stool tests.  Cultures (to look for evidence of infection).  X-rays or other imaging studies. Test results can help your caregiver make decisions about treatment or the need for additional tests. TREATMENT You need to stay well hydrated. Drink frequently but in small amounts.You may wish to drink water, sports drinks, clear broth, or eat frozen ice pops or gelatin dessert to help stay hydrated.When you eat, eating slowly may help prevent nausea.There are also some antinausea medicines that may help prevent nausea. HOME CARE INSTRUCTIONS   Take all medicine as directed by your caregiver.  If you do not have an appetite, do not force yourself to eat. However, you must continue to drink fluids.  If you have an appetite, eat a normal diet unless your caregiver tells you differently.  Eat a variety of complex carbohydrates (rice, wheat, potatoes, bread), lean meats, yogurt, fruits, and vegetables.  Avoid high-fat foods because they are more difficult to digest.  Drink enough water and fluids to keep your urine clear or pale yellow.  If you are dehydrated, ask your caregiver for specific rehydration instructions. Signs of dehydration may include:  Severe thirst.  Dry lips and mouth.  Dizziness.  Dark urine.  Decreasing urine frequency and amount.  Confusion.  Rapid breathing or pulse. SEEK IMMEDIATE MEDICAL CARE IF:   You have blood or brown flecks (like coffee grounds) in your vomit.  You have black or bloody stools.  You have a severe headache or stiff neck.  You are confused.  You have severe abdominal pain.  You have chest pain or trouble breathing.  You do not urinate at least once every 8 hours.  You develop cold or clammy skin.  You continue to vomit for longer than 24 to 48 hours.  You have a fever. MAKE SURE YOU:   Understand these instructions.  Will watch your condition.  Will get help right  away if you are not doing well or get worse.   This information is not intended to replace advice given to you by your health care provider. Make sure you discuss any questions you have with your health care provider.   Document Released: 08/15/2005 Document Revised: 11/07/2011 Document Reviewed: 01/12/2011 Elsevier Interactive Patient Education 2016 [employer] Inc.  Ciprofloxacin tablets What is this medicine? CIPROFLOXACIN (sip roe FLOX a sin) is a quinolone antibiotic. It is used to treat certain kinds of bacterial infections. It will not work for colds, flu, or other viral infections. This medicine may be used for other purposes; ask your health care provider or pharmacist if you have questions. What should I tell my health care provider before I take this medicine? They need to know if you have any of these conditions: -bone problems -cerebral disease -history of low levels of potassium in the blood -joint problems -irregular heartbeat -kidney disease -myasthenia gravis -seizures -tendon problems -tingling of the fingers or toes, or other nerve disorder -an unusual or allergic reaction to ciprofloxacin, other antibiotics or medicines, foods, dyes, or preservatives -pregnant or trying to get pregnant -breast-feeding How should I use this medicine? Take this medicine by mouth with a glass of water. Follow the directions on the prescription label. Take  your medicine at regular intervals. Do not take your medicine more often than directed. Take all of your medicine as directed even if you think your are better. Do not skip doses or stop your medicine early. You can take this medicine with food or on an empty stomach. It can be taken with a meal that contains dairy or calcium, but do not take it alone with a dairy product, like milk or yogurt or calcium-fortified juice. A special MedGuide will be given to you by the pharmacist with each prescription and refill. Be sure to read this  information carefully each time. Talk to your pediatrician regarding the use of this medicine in children. Special care may be needed. Overdosage: If you think you have taken too much of this medicine contact a poison control center or emergency room at once. NOTE: This medicine is only for you. Do not share this medicine with others. What if I miss a dose? If you miss a dose, take it as soon as you can. If it is almost time for your next dose, take only that dose. Do not take double or extra doses. What may interact with this medicine? Do not take this medicine with any of the following medications: -cisapride -droperidol -terfenadine -tizanidine This medicine may also interact with the following medications: -antacids -birth control pills -caffeine -cyclosporin -didanosine (ddI) buffered tablets or powder -medicines for diabetes -medicines for inflammation like ibuprofen, naproxen -methotrexate -multivitamins -omeprazole -phenytoin -probenecid -sucralfate -theophylline -warfarin This list may not describe all possible interactions. Give your health care provider a list of all the medicines, herbs, non-prescription drugs, or dietary supplements you use. Also tell them if you smoke, drink alcohol, or use illegal drugs. Some items may interact with your medicine. What should I watch for while using this medicine? Tell your doctor or health care professional if your symptoms do not improve. Do not treat diarrhea with over the counter products. Contact your doctor if you have diarrhea that lasts more than 2 days or if it is severe and watery. You may get drowsy or dizzy. Do not drive, use machinery, or do anything that needs mental alertness until you know how this medicine affects you. Do not stand or sit up quickly, especially if you are an older patient. This reduces the risk of dizzy or fainting spells. This medicine can make you more sensitive to the sun. Keep out of the sun. If  you cannot avoid being in the sun, wear protective clothing and use sunscreen. Do not use sun lamps or tanning beds/booths. Avoid antacids, aluminum, calcium, iron, magnesium, and zinc products for 6 hours before and 2 hours after taking a dose of this medicine. What side effects may I notice from receiving this medicine? Side effects that you should report to your doctor or health care professional as soon as possible: -allergic reactions like skin rash or hives, swelling of the face, lips, or tongue -anxious -confusion -depressed mood -diarrhea -fast, irregular heartbeat -hallucination, loss of contact with reality -joint, muscle, or tendon pain or swelling -pain, tingling, numbness in the hands or feet -suicidal thoughts or other mood changes -sunburn -unusually weak or tired Side effects that usually do not require medical attention (report to your doctor or health care professional if they continue or are bothersome): -dry mouth -headache -nausea -trouble sleeping This list may not describe all possible side effects. Call your doctor for medical advice about side effects. You may report side effects to FDA at 1-800-FDA-1088. Where should  I keep my medicine? Keep out of the reach of children. Store at room temperature below 30 degrees C (86 degrees F). Keep container tightly closed. Throw away any unused medicine after the expiration date. NOTE: This sheet is a summary. It may not cover all possible information. If you have questions about this medicine, talk to your doctor, pharmacist, or health care provider.    2016, Elsevier/[employer] Standard. (2015-03-26 12:57:02)  Metronidazole tablets or capsules What is this medicine? METRONIDAZOLE (me troe NI da zole) is an antiinfective. It is used to treat certain kinds of bacterial and protozoal infections. It will not work for colds, flu, or other viral infections. This medicine may be used for other purposes; ask your health care  provider or pharmacist if you have questions. What should I tell my health care provider before I take this medicine? They need to know if you have any of these conditions: -anemia or other blood disorders -disease of the nervous system -fungal or yeast infection -if you drink alcohol containing drinks -liver disease -seizures -an unusual or allergic reaction to metronidazole, or other medicines, foods, dyes, or preservatives -pregnant or trying to get pregnant -breast-feeding How should I use this medicine? Take this medicine by mouth with a full glass of water. Follow the directions on the prescription label. Take your medicine at regular intervals. Do not take your medicine more often than directed. Take all of your medicine as directed even if you think you are better. Do not skip doses or stop your medicine early. Talk to your pediatrician regarding the use of this medicine in children. Special care may be needed. Overdosage: If you think you have taken too much of this medicine contact a poison control center or emergency room at once. NOTE: This medicine is only for you. Do not share this medicine with others. What if I miss a dose? If you miss a dose, take it as soon as you can. If it is almost time for your next dose, take only that dose. Do not take double or extra doses. What may interact with this medicine? Do not take this medicine with any of the following medications: -alcohol or any product that contains alcohol -amprenavir oral solution -cisapride -disulfiram -dofetilide -dronedarone -paclitaxel injection -pimozide -ritonavir oral solution -sertraline oral solution -sulfamethoxazole-trimethoprim injection -thioridazine -ziprasidone This medicine may also interact with the following medications: -birth control pills -cimetidine -lithium -other medicines that prolong the QT interval (cause an abnormal heart rhythm) -phenobarbital -phenytoin -warfarin This list  may not describe all possible interactions. Give your health care provider a list of all the medicines, herbs, non-prescription drugs, or dietary supplements you use. Also tell them if you smoke, drink alcohol, or use illegal drugs. Some items may interact with your medicine. What should I watch for while using this medicine? Tell your doctor or health care professional if your symptoms do not improve or if they get worse. You may get drowsy or dizzy. Do not drive, use machinery, or do anything that needs mental alertness until you know how this medicine affects you. Do not stand or sit up quickly, especially if you are an older patient. This reduces the risk of dizzy or fainting spells. Avoid alcoholic drinks while you are taking this medicine and for three days afterward. Alcohol may make you feel dizzy, sick, or flushed. If you are being treated for a sexually transmitted disease, avoid sexual contact until you have finished your treatment. Your sexual partner may also need treatment. What  side effects may I notice from receiving this medicine? Side effects that you should report to your doctor or health care professional as soon as possible: -allergic reactions like skin rash or hives, swelling of the face, lips, or tongue -confusion, clumsiness -difficulty speaking -discolored or sore mouth -dizziness -fever, infection -numbness, tingling, pain or weakness in the hands or feet -trouble passing urine or change in the amount of urine -redness, blistering, peeling or loosening of the skin, including inside the mouth -seizures -unusually weak or tired -vaginal irritation, dryness, or discharge Side effects that usually do not require medical attention (report to your doctor or health care professional if they continue or are bothersome): -diarrhea -headache -irritability -metallic taste -nausea -stomach pain or cramps -trouble sleeping This list may not describe all possible side  effects. Call your doctor for medical advice about side effects. You may report side effects to FDA at 1-800-FDA-1088. Where should I keep my medicine? Keep out of the reach of children. Store at room temperature below 25 degrees C (77 degrees F). Protect from light. Keep container tightly closed. Throw away any unused medicine after the expiration date. NOTE: This sheet is a summary. It may not cover all possible information. If you have questions about this medicine, talk to your doctor, pharmacist, or health care provider.    2016, Elsevier/[employer] Standard. (2013-03-22 14:08:39)  Tramadol tablets What is this medicine? TRAMADOL (TRA ma dole) is a pain reliever. It is used to treat moderate to severe pain in adults. This medicine may be used for other purposes; ask your health care provider or pharmacist if you have questions. What should I tell my health care provider before I take this medicine? They need to know if you have any of these conditions: -brain tumor -depression -drug abuse or addiction -head injury -if you frequently drink alcohol containing drinks -kidney disease or trouble passing urine -liver disease -lung disease, asthma, or breathing problems -seizures or epilepsy -suicidal thoughts, plans, or attempt; a previous suicide attempt by you or a family member -an unusual or allergic reaction to tramadol, codeine, other medicines, foods, dyes, or preservatives -pregnant or trying to get pregnant -breast-feeding How should I use this medicine? Take this medicine by mouth with a full glass of water. Follow the directions on the prescription label. If the medicine upsets your stomach, take it with food or milk. Do not take more medicine than you are told to take. Talk to your pediatrician regarding the use of this medicine in children. Special care may be needed. Overdosage: If you think you have taken too much of this medicine contact a poison control center or emergency  room at once. NOTE: This medicine is only for you. Do not share this medicine with others. What if I miss a dose? If you miss a dose, take it as soon as you can. If it is almost time for your next dose, take only that dose. Do not take double or extra doses. What may interact with this medicine? Do not take this medicine with any of the following medications: -MAOIs like Carbex, Eldepryl, Marplan, Nardil, and Parnate This medicine may also interact with the following medications: -alcohol or medicines that contain alcohol -antihistamines -benzodiazepines -bupropion -carbamazepine or oxcarbazepine -clozapine -cyclobenzaprine -digoxin -furazolidone -linezolid -medicines for depression, anxiety, or psychotic disturbances -medicines for migraine headache like almotriptan, eletriptan, frovatriptan, naratriptan, rizatriptan, sumatriptan, zolmitriptan -medicines for pain like pentazocine, buprenorphine, butorphanol, meperidine, nalbuphine, and propoxyphene -medicines for sleep -muscle relaxants -naltrexone -phenobarbital -phenothiazines like  perphenazine, thioridazine, chlorpromazine, mesoridazine, fluphenazine, prochlorperazine, promazine, and trifluoperazine -procarbazine -warfarin This list may not describe all possible interactions. Give your health care provider a list of all the medicines, herbs, non-prescription drugs, or dietary supplements you use. Also tell them if you smoke, drink alcohol, or use illegal drugs. Some items may interact with your medicine. What should I watch for while using this medicine? Tell your doctor or health care professional if your pain does not go away, if it gets worse, or if you have new or a different type of pain. You may develop tolerance to the medicine. Tolerance means that you will need a higher dose of the medicine for pain relief. Tolerance is normal and is expected if you take this medicine for a long time. Do not suddenly stop taking your  medicine because you may develop a severe reaction. Your body becomes used to the medicine. This does NOT mean you are addicted. Addiction is a behavior related to getting and using a drug for a non-medical reason. If you have pain, you have a medical reason to take pain medicine. Your doctor will tell you how much medicine to take. If your doctor wants you to stop the medicine, the dose will be slowly lowered over time to avoid any side effects. You may get drowsy or dizzy. Do not drive, use machinery, or do anything that needs mental alertness until you know how this medicine affects you. Do not stand or sit up quickly, especially if you are an older patient. This reduces the risk of dizzy or fainting spells. Alcohol can increase or decrease the effects of this medicine. Avoid alcoholic drinks. You may have constipation. Try to have a bowel movement at least every 2 to 3 days. If you do not have a bowel movement for 3 days, call your doctor or health care professional. Your mouth may get dry. Chewing sugarless gum or sucking hard candy, and drinking plenty of water may help. Contact your doctor if the problem does not go away or is severe. What side effects may I notice from receiving this medicine? Side effects that you should report to your doctor or health care professional as soon as possible: -allergic reactions like skin rash, itching or hives, swelling of the face, lips, or tongue -breathing difficulties, wheezing -confusion -itching -light headedness or fainting spells -redness, blistering, peeling or loosening of the skin, including inside the mouth -seizures Side effects that usually do not require medical attention (report to your doctor or health care professional if they continue or are bothersome): -constipation -dizziness -drowsiness -headache -nausea, vomiting This list may not describe all possible side effects. Call your doctor for medical advice about side effects. You may  report side effects to FDA at 1-800-FDA-1088. Where should I keep my medicine? Keep out of the reach of children. This medicine may cause accidental overdose and death if it taken by other adults, children, or pets. Mix any unused medicine with a substance like cat litter or coffee grounds. Then throw the medicine away in a sealed container like a sealed bag or a coffee can with a lid. Do not use the medicine after the expiration date. Store at room temperature between 15 and 30 degrees C (59 and 86 degrees F). NOTE: This sheet is a summary. It may not cover all possible information. If you have questions about this medicine, talk to your doctor, pharmacist, or health care provider.    2016, Elsevier/[employer] Standard. (2013-10-11 15:42:09)

## 2015-11-02 ENCOUNTER — Telehealth: Payer: Self-pay

## 2015-11-02 NOTE — Telephone Encounter (Signed)
Waiting on payment of $15.75 for 21 pages from [employer]

## 2015-11-11 NOTE — Telephone Encounter (Signed)
Payment received and records faxed to [place] on 11/11/15

## 2015-11-16 DIAGNOSIS — Z0271 Encounter for disability determination: Secondary | ICD-10-CM

## 2015-11-25 LAB — HM COLONOSCOPY

## 2015-12-01 ENCOUNTER — Other Ambulatory Visit: Payer: Self-pay | Admitting: Orthopedic Surgery

## 2015-12-09 ENCOUNTER — Encounter (HOSPITAL_BASED_OUTPATIENT_CLINIC_OR_DEPARTMENT_OTHER): Payer: Self-pay | Admitting: *Deleted

## 2015-12-17 ENCOUNTER — Ambulatory Visit (HOSPITAL_BASED_OUTPATIENT_CLINIC_OR_DEPARTMENT_OTHER)
Admission: RE | Admit: 2015-12-17 | Payer: PRIVATE HEALTH INSURANCE | Source: Ambulatory Visit | Admitting: Orthopedic Surgery

## 2015-12-17 SURGERY — CARPECTOMY WITH RADIAL STYLOIDECTOMY
Anesthesia: Choice | Site: Wrist | Laterality: Right

## 2016-07-06 ENCOUNTER — Other Ambulatory Visit (INDEPENDENT_AMBULATORY_CARE_PROVIDER_SITE_OTHER): Payer: 59

## 2016-07-06 ENCOUNTER — Ambulatory Visit (INDEPENDENT_AMBULATORY_CARE_PROVIDER_SITE_OTHER): Payer: 59 | Admitting: Internal Medicine

## 2016-07-06 ENCOUNTER — Encounter: Payer: Self-pay | Admitting: Internal Medicine

## 2016-07-06 VITALS — BP 114/80 | HR 103 | Temp 97.8°F | Ht 67.0 in | Wt 185.5 lb

## 2016-07-06 DIAGNOSIS — R739 Hyperglycemia, unspecified: Secondary | ICD-10-CM

## 2016-07-06 DIAGNOSIS — E785 Hyperlipidemia, unspecified: Secondary | ICD-10-CM

## 2016-07-06 DIAGNOSIS — R7989 Other specified abnormal findings of blood chemistry: Secondary | ICD-10-CM | POA: Diagnosis not present

## 2016-07-06 DIAGNOSIS — Z0000 Encounter for general adult medical examination without abnormal findings: Secondary | ICD-10-CM | POA: Diagnosis not present

## 2016-07-06 DIAGNOSIS — R945 Abnormal results of liver function studies: Secondary | ICD-10-CM

## 2016-07-06 DIAGNOSIS — I10 Essential (primary) hypertension: Secondary | ICD-10-CM | POA: Diagnosis not present

## 2016-07-06 LAB — CBC WITH DIFFERENTIAL/PLATELET
BASOS ABS: 0 10*3/uL (ref 0.0–0.1)
Basophils Relative: 0.3 % (ref 0.0–3.0)
EOS PCT: 3.5 % (ref 0.0–5.0)
Eosinophils Absolute: 0.3 10*3/uL (ref 0.0–0.7)
HCT: 46.4 % (ref 39.0–52.0)
HEMOGLOBIN: 15.8 g/dL (ref 13.0–17.0)
Lymphocytes Relative: 26.7 % (ref 12.0–46.0)
Lymphs Abs: 2 10*3/uL (ref 0.7–4.0)
MCHC: 34.1 g/dL (ref 30.0–36.0)
MCV: 92.8 fl (ref 78.0–100.0)
MONOS PCT: 8.4 % (ref 3.0–12.0)
Monocytes Absolute: 0.6 10*3/uL (ref 0.1–1.0)
Neutro Abs: 4.6 10*3/uL (ref 1.4–7.7)
Neutrophils Relative %: 61.1 % (ref 43.0–77.0)
Platelets: 262 10*3/uL (ref 150.0–400.0)
RBC: 5 Mil/uL (ref 4.22–5.81)
RDW: 14.4 % (ref 11.5–15.5)
WBC: 7.5 10*3/uL (ref 4.0–10.5)

## 2016-07-06 LAB — COMPREHENSIVE METABOLIC PANEL
ALBUMIN: 4.8 g/dL (ref 3.5–5.2)
ALK PHOS: 37 U/L — AB (ref 39–117)
ALT: 49 U/L (ref 0–53)
AST: 46 U/L — ABNORMAL HIGH (ref 0–37)
BILIRUBIN TOTAL: 0.3 mg/dL (ref 0.2–1.2)
BUN: 11 mg/dL (ref 6–23)
CALCIUM: 9.8 mg/dL (ref 8.4–10.5)
CO2: 30 mEq/L (ref 19–32)
Chloride: 99 mEq/L (ref 96–112)
Creatinine, Ser: 0.77 mg/dL (ref 0.40–1.50)
GFR: 135.34 mL/min (ref >60.00)
Glucose, Bld: 103 mg/dL — ABNORMAL HIGH (ref 70–99)
POTASSIUM: 3.8 meq/L (ref 3.5–5.1)
Sodium: 138 mEq/L (ref 135–145)
TOTAL PROTEIN: 7.3 g/dL (ref 6.0–8.3)

## 2016-07-06 LAB — LIPID PANEL
CHOL/HDL RATIO: 4
CHOLESTEROL: 257 mg/dL — AB (ref 0–200)
HDL: 58.1 mg/dL (ref >39.00)
NonHDL: 199.34
Triglycerides: 293 mg/dL — ABNORMAL HIGH (ref 0.0–149.0)
VLDL: 58.6 mg/dL — AB (ref 0.0–40.0)

## 2016-07-06 LAB — PSA: PSA: 2.71 ng/mL (ref 0.10–4.00)

## 2016-07-06 LAB — HEMOGLOBIN A1C: Hgb A1c MFr Bld: 6.3 % (ref 4.6–6.5)

## 2016-07-06 LAB — LDL CHOLESTEROL, DIRECT: Direct LDL: 165 mg/dL

## 2016-07-06 NOTE — Progress Notes (Addendum)
Subjective:  Patient ID: Tristan Parker, male    DOB: October 31, 1960  Age: 55 y.o. MRN: [id number]  CC: Hypertension; Hyperlipidemia; and Annual Exam   HPI [facility] presents for a CPX.  He tells me that his BP is well controlled. He feels well and offers no complaints.  Outpatient Medications Prior to Visit  Medication Sig Dispense Refill  . amLODipine (NORVASC) 5 MG tablet Take 5 mg by mouth daily.    Marland Kitchen lisinopril (PRINIVIL,ZESTRIL) 10 MG tablet Take 10 mg by mouth daily.    . pravastatin (PRAVACHOL) 20 MG tablet Take 20 mg by mouth daily.    . sildenafil (REVATIO) 20 MG tablet Take 2-5 tablets by mouth as needed. sex    . naproxen (NAPROSYN) 500 MG tablet Take 1 tablet (500 mg total) by mouth 2 (two) times daily. 30 tablet 0  . traMADol (ULTRAM) 50 MG tablet Take 1 tablet (50 mg total) by mouth every 6 (six) hours as needed. 10 tablet 0   No facility-administered medications prior to visit.     ROS Review of Systems  Constitutional: Negative for activity change, appetite change, diaphoresis, fatigue and fever.  HENT: Negative.   Eyes: Negative.  Negative for visual disturbance.  Respiratory: Negative for cough, choking, chest tightness, shortness of breath and stridor.   Cardiovascular: Negative.  Negative for chest pain, palpitations and leg swelling.  Gastrointestinal: Negative for abdominal pain, blood in stool, constipation, diarrhea, nausea and vomiting.  Endocrine: Negative.   Genitourinary: Negative.  Negative for difficulty urinating, dysuria, flank pain, frequency, hematuria, penile swelling, scrotal swelling, testicular pain and urgency.  Musculoskeletal: Negative.  Negative for arthralgias, joint swelling and myalgias.  Skin: Negative.  Negative for color change and rash.  Allergic/Immunologic: Negative.   Neurological: Negative.  Negative for dizziness, weakness, light-headedness and headaches.  Hematological: Negative.  Negative for adenopathy. Does not  bruise/bleed easily.  Psychiatric/Behavioral: Negative.     Objective:  BP 114/80 (BP Location: Left Arm, Patient Position: Sitting, Cuff Size: Large)   Pulse (!) 103   Temp 97.8 F (36.6 C) (Oral)   Ht 5\' 7"  (1.702 m)   Wt 185 lb 8 oz (84.1 kg)   SpO2 95%   BMI 29.05 kg/m   BP Readings from Last 3 Encounters:  07/06/16 114/80  06/20/15 158/91  03/20/15 122/90    Wt Readings from Last 3 Encounters:  07/06/16 185 lb 8 oz (84.1 kg)  06/19/15 188 lb (85.3 kg)  03/20/15 185 lb (83.9 kg)    Physical Exam  Constitutional: He is oriented to person, place, and time. No distress.  HENT:  Mouth/Throat: Oropharynx is clear and moist. No oropharyngeal exudate.  Eyes: Conjunctivae are normal. Right eye exhibits no discharge. Left eye exhibits no discharge. No scleral icterus.  Neck: Normal range of motion. Neck supple. No JVD present. No tracheal deviation present. No thyromegaly present.  Cardiovascular: Normal rate, regular rhythm, normal heart sounds and intact distal pulses.  Exam reveals no gallop and no friction rub.   No murmur heard. Pulses:      Carotid pulses are 1+ on the right side, and 1+ on the left side.      Radial pulses are 1+ on the right side, and 1+ on the left side.       Femoral pulses are 1+ on the right side, and 1+ on the left side.      Popliteal pulses are 1+ on the right side, and 1+ on the left  side.       Dorsalis pedis pulses are 1+ on the right side, and 1+ on the left side.       Posterior tibial pulses are 1+ on the right side, and 1+ on the left side.  EKG ----  Sinus  Rhythm  WITHIN NORMAL LIMITS   Pulmonary/Chest: Effort normal and breath sounds normal. No stridor. No respiratory distress. He has no wheezes. He has no rales. He exhibits no tenderness.  Abdominal: Soft. Bowel sounds are normal. He exhibits no distension and no mass. There is no tenderness. There is no rebound and no guarding. Hernia confirmed negative in the right inguinal area  and confirmed negative in the left inguinal area.  Genitourinary: Rectum normal, prostate normal, testes normal and penis normal. Rectal exam shows no external hemorrhoid, no internal hemorrhoid, no fissure, no tenderness, anal tone normal and guaiac negative stool. Prostate is not enlarged and not tender. Right testis shows no mass, no swelling and no tenderness. Right testis is descended. Left testis shows no mass, no swelling and no tenderness. Left testis is descended. Uncircumcised. No phimosis, paraphimosis, hypospadias, penile erythema or penile tenderness. No discharge found.  Musculoskeletal: Normal range of motion. He exhibits no edema, tenderness or deformity.  Lymphadenopathy:    He has no cervical adenopathy.       Right: No inguinal adenopathy present.       Left: No inguinal adenopathy present.  Neurological: He is oriented to person, place, and time.  Skin: Skin is warm and dry. No rash noted. He is not diaphoretic. No erythema. No pallor.  Psychiatric: He has a normal mood and affect. His behavior is normal. Judgment and thought content normal.  Vitals reviewed.   Lab Results  Component Value Date   WBC 7.5 07/06/2016   HGB 15.8 07/06/2016   HCT 46.4 07/06/2016   PLT 262.0 07/06/2016   GLUCOSE 103 (H) 07/06/2016   CHOL 257 (H) 07/06/2016   TRIG 293.0 (H) 07/06/2016   HDL 58.10 07/06/2016   LDLDIRECT 165.0 07/06/2016   LDLCALC 85 01/17/2013   ALT 49 07/06/2016   AST 46 (H) 07/06/2016   NA 138 07/06/2016   K 3.8 07/06/2016   CL 99 07/06/2016   CREATININE 0.77 07/06/2016   BUN 11 07/06/2016   CO2 30 07/06/2016   TSH 2.41 10/25/2012   PSA 2.71 07/06/2016   HGBA1C 6.3 07/06/2016    Ct Abdomen Pelvis W Contrast  Result Date: 06/20/2015 CLINICAL DATA:  Abdominal pain and emesis for 2 hours. EXAM: CT ABDOMEN AND PELVIS WITH CONTRAST TECHNIQUE: Multidetector CT imaging of the abdomen and pelvis was performed using the standard protocol following bolus administration  of intravenous contrast. CONTRAST:  68mL OMNIPAQUE IOHEXOL 300 MG/ML SOLN, 140mL OMNIPAQUE IOHEXOL 300 MG/ML SOLN COMPARISON:  None. FINDINGS: Lower chest:  The included lung bases are clear. Liver: Decreased density consistent with steatosis. A 7 mm hypodensity within segment 6 is too small to accurately characterize. Trace perihepatic ascites. Hepatobiliary: Gallbladder is decompressed.  No biliary dilatation. Pancreas: Normal. Spleen: Normal. Adrenal glands: No nodule. Kidneys: Symmetric renal enhancement and excretion. No hydronephrosis. No localizing renal abnormality. Stomach/Bowel: Stomach physiologically distended. There are fluid-filled normal caliber small bowel loops in the left mid abdomen with small bowel thickening involving a short segment. Mild adjacent mesenteric edema. Small volume of stool throughout the colon without colonic wall thickening. Mild diverticulosis of the descending colon without diverticulitis. The appendix is normal. Vascular/Lymphatic: No retroperitoneal adenopathy. Abdominal aorta is normal  in caliber. Minimal atherosclerosis without aneurysm. Reproductive: Prostate gland normal in size. Bladder: Physiologically distended without wall thickening. Other: No free air or intra-abdominal fluid collection. Small amount of perihepatic, right pericolic gutter, and pelvic free fluid, likely reactive. Musculoskeletal: There are no acute or suspicious osseous abnormalities. Degenerative change in the lumbar spine. IMPRESSION: 1. Short segment small bowel thickening with adjacent inflammation and mesenteric edema. Findings suggest nonspecific enteritis, may be infectious or inflammatory. Small amount free fluid is likely reactive. 2. Incidental findings include diverticulosis without diverticulitis and hepatic steatosis. Electronically Signed   By: Jeb Levering M.D.   On: 06/20/2015 02:14    Assessment & Plan:   Baldomero was seen today for hypertension, hyperlipidemia and annual  exam.  Diagnoses and all orders for this visit:  Essential hypertension, benign- His blood pressure is well controlled on the ACE inhibitor, his EKG is neg for LVH, electrolytes and renal function are stable -     EKG 12-Lead  Routine general medical examination at a health care facility- exam completed, labs ordered and reviewed, he refused all vaccines today, he says he had a colonoscopy earlier this year, patient education material was given. 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 -     Hepatitis A antibody, total; Future -     Hepatitis B core antibody, total; Future -     Hepatitis B surface antibody; Future -     Hepatitis C antibody; Future -     US Abdomen Complete; Future  Hyperglycemia- his A1c is 6.3%, he is prediabetic, no medications are needed at this time. -     Hemoglobin A1c; Future  Hyperlipidemia with target low density lipoprotein (LDL) cholesterol less than 130 mg/dL- he has not achieved his LDL goal, he agrees to restart statin therapy. -     pravastatin (PRAVACHOL) 20 MG tablet; Take 1 tablet (20 mg total) by mouth daily.   I have discontinued Mr. Francella Solian Hilaire's naproxen, lisinopril, sildenafil, and traMADol. I have also changed his pravastatin. Additionally, I am having him maintain his amLODipine and lisinopril-hydrochlorothiazide.  Meds ordered this encounter  Medications  . lisinopril-hydrochlorothiazide (PRINZIDE,ZESTORETIC) 20-25 MG tablet    Sig: Take by mouth.  . pravastatin (PRAVACHOL) 20 MG tablet    Sig: Take 1 tablet (20 mg total) by mouth daily.     Dispense:  90 tablet    Refill:  3     Follow-up: Return in about 6 months (around 01/03/2017).  Scarlette Calico, MD

## 2016-07-06 NOTE — Progress Notes (Signed)
Pre visit review using our clinic review tool, if applicable. No additional management support is needed unless otherwise documented below in the visit note. 

## 2016-07-06 NOTE — Patient Instructions (Signed)

## 2016-07-07 ENCOUNTER — Encounter: Payer: Self-pay | Admitting: Internal Medicine

## 2016-07-07 LAB — HEPATITIS A ANTIBODY, TOTAL: Hep A Total Ab: REACTIVE — AB

## 2016-07-07 LAB — HEPATITIS B CORE ANTIBODY, TOTAL: HEP B C TOTAL AB: REACTIVE — AB

## 2016-07-07 LAB — HIV ANTIBODY (ROUTINE TESTING W REFLEX): HIV: NONREACTIVE

## 2016-07-07 LAB — HEPATITIS C ANTIBODY: HCV Ab: NEGATIVE

## 2016-07-07 LAB — HEPATITIS B SURFACE ANTIBODY,QUALITATIVE: Hep B S Ab: NEGATIVE

## 2016-07-07 MED ORDER — PRAVASTATIN SODIUM 20 MG PO TABS: 20.0000 mg | ORAL_TABLET | Freq: Every day | ORAL | 3 refills | Status: DC

## 2016-07-19 ENCOUNTER — Ambulatory Visit
Admission: RE | Admit: 2016-07-19 | Discharge: 2016-07-19 | Disposition: A | Discharge status: Home / Self Care | Payer: 59 | Source: Ambulatory Visit | Attending: Internal Medicine | Admitting: Internal Medicine

## 2016-07-19 ENCOUNTER — Encounter: Payer: Self-pay | Admitting: Internal Medicine

## 2016-07-19 DIAGNOSIS — R7989 Other specified abnormal findings of blood chemistry: Secondary | ICD-10-CM

## 2016-07-19 DIAGNOSIS — R945 Abnormal results of liver function studies: Principal | ICD-10-CM

## 2016-07-26 ENCOUNTER — Telehealth: Payer: Self-pay

## 2016-07-26 DIAGNOSIS — E785 Hyperlipidemia, unspecified: Secondary | ICD-10-CM

## 2016-07-26 MED ORDER — PRAVASTATIN SODIUM 20 MG PO TABS: 20.0000 mg | ORAL_TABLET | Freq: Every day | ORAL | 1 refills | Status: DC

## 2016-07-26 MED ORDER — LISINOPRIL-HYDROCHLOROTHIAZIDE 20-25 MG PO TABS: 1.0000 | ORAL_TABLET | Freq: Every day | ORAL | 1 refills | Status: DC

## 2016-07-26 MED ORDER — AMLODIPINE BESYLATE 5 MG PO TABS: 5.0000 mg | ORAL_TABLET | Freq: Every day | ORAL | 1 refills | Status: DC

## 2016-07-26 NOTE — Telephone Encounter (Signed)
Pt came in for results. Rx sent for maintenance meds.

## 2017-01-01 ENCOUNTER — Encounter (HOSPITAL_COMMUNITY): Payer: Self-pay | Admitting: Emergency Medicine

## 2017-01-01 ENCOUNTER — Emergency Department (HOSPITAL_COMMUNITY)
Admission: EM | Admit: 2017-01-01 | Discharge: 2017-01-01 | Disposition: A | Discharge status: Home / Self Care | Payer: Worker's Compensation | Attending: Emergency Medicine | Admitting: Emergency Medicine

## 2017-01-01 ENCOUNTER — Emergency Department (HOSPITAL_COMMUNITY): Payer: Worker's Compensation

## 2017-01-01 DIAGNOSIS — W010XXA Fall on same level from slipping, tripping and stumbling without subsequent striking against object, initial encounter: Secondary | ICD-10-CM | POA: Diagnosis not present

## 2017-01-01 DIAGNOSIS — M25531 Pain in right wrist: Secondary | ICD-10-CM | POA: Diagnosis not present

## 2017-01-01 DIAGNOSIS — Z79899 Other long term (current) drug therapy: Secondary | ICD-10-CM | POA: Insufficient documentation

## 2017-01-01 DIAGNOSIS — Y990 Civilian activity done for income or pay: Secondary | ICD-10-CM | POA: Insufficient documentation

## 2017-01-01 DIAGNOSIS — Y9289 Other specified places as the place of occurrence of the external cause: Secondary | ICD-10-CM | POA: Diagnosis not present

## 2017-01-01 DIAGNOSIS — W19XXXA Unspecified fall, initial encounter: Secondary | ICD-10-CM

## 2017-01-01 DIAGNOSIS — I10 Essential (primary) hypertension: Secondary | ICD-10-CM | POA: Insufficient documentation

## 2017-01-01 DIAGNOSIS — M542 Cervicalgia: Secondary | ICD-10-CM | POA: Diagnosis not present

## 2017-01-01 DIAGNOSIS — F17210 Nicotine dependence, cigarettes, uncomplicated: Secondary | ICD-10-CM | POA: Diagnosis not present

## 2017-01-01 DIAGNOSIS — M546 Pain in thoracic spine: Secondary | ICD-10-CM | POA: Insufficient documentation

## 2017-01-01 DIAGNOSIS — M545 Low back pain, unspecified: Secondary | ICD-10-CM

## 2017-01-01 DIAGNOSIS — Y9389 Activity, other specified: Secondary | ICD-10-CM | POA: Diagnosis not present

## 2017-01-01 DIAGNOSIS — R51 Headache: Secondary | ICD-10-CM | POA: Insufficient documentation

## 2017-01-01 DIAGNOSIS — S6991XA Unspecified injury of right wrist, hand and finger(s), initial encounter: Secondary | ICD-10-CM | POA: Diagnosis present

## 2017-01-01 MED ORDER — METHOCARBAMOL 500 MG PO TABS: 500.0000 mg | ORAL_TABLET | Freq: Two times a day (BID) | ORAL | 0 refills | Status: DC

## 2017-01-01 MED ORDER — NAPROXEN 375 MG PO TABS: 375.0000 mg | ORAL_TABLET | Freq: Two times a day (BID) | ORAL | 0 refills | Status: DC

## 2017-01-01 MED ORDER — IBUPROFEN 200 MG PO TABS
600.0000 mg | ORAL_TABLET | Freq: Once | ORAL | Status: AC
  Administered 2017-01-01: 600 mg via ORAL
  Filled 2017-01-01: qty 3

## 2017-01-01 NOTE — ED Notes (Signed)
Bed: WA03 Expected date:  Expected time:  Means of arrival:  Comments: 56 yo fall, back pain

## 2017-01-01 NOTE — ED Provider Notes (Signed)
[place] DEPT Provider Note   CSN: [id number] Arrival date & time: 01/01/17  1131     History   Chief Complaint Chief Complaint  Patient presents with  . Fall    HPI Tristan Parker is a 56 y.o. male with history of hypertension, hyperlipidemia presents following a fall. 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 Patient reports putting his right hand out to catch himself behind him. He hit his head on the floor. He did not lose consciousness, however noted blurred vision and lightheadedness for the following. Patient has a bump on the back of his head. He is also complaining of back pain. Patient's back pain and wrist are worse with movement. He denies any numbness or tingling, fevers, chest pain, shortness of breath, abdominal pain, nausea, vomiting, urinary symptoms.  HPI  Past Medical History:  Diagnosis Date  . GERD (gastroesophageal reflux disease)   . Hyperlipemia   . Hypertension     Patient Active Problem List   Diagnosis Date Noted  . Elevated LFTs 07/06/2016  . Hyperlipidemia with target low density lipoprotein (LDL) cholesterol less than 130 mg/dL 11/09/2012  . Essential hypertension, benign 10/25/2012  . Routine general medical examination at a health care facility 10/25/2012  . Erectile dysfunction 10/25/2012  . Nonspecific abnormal electrocardiogram (ECG) (EKG) 10/25/2012    Past Surgical History:  Procedure Laterality Date  . FOREIGN BODY REMOVAL     as a child from leg  . None         Home Medications    Prior to Admission medications   Medication Sig Start Date End Date Taking? Authorizing Provider  amLODipine (NORVASC) 5 MG tablet Take 1 tablet (5 mg total) by mouth daily. 07/26/16 01/21/18 Yes Janith Lima, MD  lisinopril-hydrochlorothiazide (PRINZIDE,ZESTORETIC) 20-25 MG tablet Take 1 tablet by mouth daily. 07/26/16 07/26/17 Yes Janith Lima, MD  pravastatin (PRAVACHOL) 20 MG tablet Take 1  tablet (20 mg total) by mouth daily. 07/26/16  Yes Janith Lima, MD  methocarbamol (ROBAXIN) 500 MG tablet Take 1 tablet (500 mg total) by mouth 2 (two) times daily. 01/01/17   Deshanti Adcox, Bea Graff, PA-C  naproxen (NAPROSYN) 375 MG tablet Take 1 tablet (375 mg total) by mouth 2 (two) times daily. 01/01/17   Frederica Kuster, PA-C    Family History Family History  Problem Relation Age of Onset  . Stroke Mother   . Stroke Father   . Hypertension Father   . Cancer Neg Hx   . Alcohol abuse Neg Hx   . Asthma Neg Hx   . COPD Neg Hx   . Depression Neg Hx   . Diabetes Neg Hx   . Drug abuse Neg Hx   . Early death Neg Hx   . Hearing loss Neg Hx   . Heart disease Neg Hx   . Hyperlipidemia Neg Hx   . Kidney disease Neg Hx     Social History Social History  Substance Use Topics  . Smoking status: Current Every Day Smoker    Packs/day: 0.25    Years: 25.00    Types: Cigarettes  . Smokeless tobacco: Never Used  . Alcohol use 8.4 oz/week    14 Cans of beer per week     Allergies   Patient has no known allergies.   Review of Systems Review of Systems  Constitutional: Negative for chills and fever.  HENT: Negative for facial swelling and sore throat.  Eyes: Positive for visual disturbance (resolved).  Respiratory: Negative for shortness of breath.   Cardiovascular: Negative for chest pain.  Gastrointestinal: Negative for abdominal pain, nausea and vomiting.  Genitourinary: Negative for dysuria.  Musculoskeletal: Positive for arthralgias, back pain and neck pain.  Skin: Negative for rash and wound.  Neurological: Positive for light-headedness and headaches.  Psychiatric/Behavioral: The patient is not nervous/anxious.      Physical Exam Updated Vital Signs BP (!) 154/130   Pulse 84   Temp 99.1 F (37.3 C) (Oral)   Resp 17   Ht 5\' 7"  (1.702 m)   Wt 83.9 kg   SpO2 97%   BMI 28.98 kg/m   Physical Exam  Constitutional: He appears well-developed and well-nourished. No  distress.  HENT:  Head: Normocephalic and atraumatic.    Mouth/Throat: Oropharynx is clear and moist. No oropharyngeal exudate.  Eyes: Conjunctivae and EOM are normal. Pupils are equal, round, and reactive to light. Right eye exhibits no discharge. Left eye exhibits no discharge. No scleral icterus.  Neck: Normal range of motion. Neck supple. No thyromegaly present.  Cardiovascular: Normal rate, regular rhythm, normal heart sounds and intact distal pulses.  Exam reveals no gallop and no friction rub.   No murmur heard. Pulmonary/Chest: Effort normal and breath sounds normal. No stridor. No respiratory distress. He has no wheezes. He has no rales.  Abdominal: Soft. Bowel sounds are normal. He exhibits no distension. There is no tenderness. There is no rebound and no guarding.  Musculoskeletal: He exhibits no edema.       Right wrist: He exhibits tenderness and bony tenderness.       Cervical back: He exhibits bony tenderness (C7).       Thoracic back: He exhibits bony tenderness.       Lumbar back: He exhibits bony tenderness.  Right wrist: Range of motion intact with pain; radial and ulnar tenderness; anatomical snuffbox tenderness; normal sensation; radial pulses intact; cap refill <2secs;  Lymphadenopathy:    He has no cervical adenopathy.  Neurological: He is alert. Coordination normal.  CN 3-12 intact; normal sensation throughout; 5/5 strength in all 4 extremities; equal bilateral grip strength  Skin: Skin is warm and dry. No rash noted. He is not diaphoretic. No pallor.  Psychiatric: He has a normal mood and affect.  Nursing note and vitals reviewed.    ED Treatments / Results  Labs (all labs ordered are listed, but only abnormal results are displayed) Labs Reviewed - No data to display  EKG  EKG Interpretation None       Radiology Dg Thoracic Spine 2 View  Result Date: 01/01/2017 CLINICAL DATA:  Fall at work today. Low back pain radiating into the right leg. EXAM:  THORACIC SPINE 2 VIEWS COMPARISON:  Chest radiographs 09/30/2012. FINDINGS: There are 12 rib-bearing thoracic type vertebral bodies. The alignment is normal. No evidence of acute fracture, paraspinal hematoma or widening of the interpedicular distance. There are degenerative changes throughout the thoracic spine with paraspinal osteophytes inferiorly. IMPRESSION: No evidence of acute thoracic spine injury. Electronically Signed   By: Richardean Sale M.D.   On: 01/01/2017 12:41   Dg Lumbar Spine Complete  Result Date: 01/01/2017 CLINICAL DATA:  Fall at work today. Low back pain radiating into the right leg. EXAM: LUMBAR SPINE - COMPLETE 4+ VIEW COMPARISON:  Radiographs 03/13/2015.  abdominopelvic CT 06/20/2015. FINDINGS: Five lumbar type vertebral bodies. There is a stable convex left scoliosis. There is a progressive anterolisthesis at L3-4 with associated endplate  osteophytes and sclerosis. There are underlying chronic bilateral L3 pars defects with worsening facet hypertrophy. The additional disc spaces are preserved. There are scattered paraspinal osteophytes. No evidence of acute fracture. Stable occult spinal dysraphism at T11. IMPRESSION: 1. No acute findings are demonstrated. 2. Progressive L3-4 spondylosis and anterolisthesis secondary to underlying chronic L3 pars defects, potentially symptomatic. Electronically Signed   By: Richardean Sale M.D.   On: 01/01/2017 12:46   Dg Wrist Complete Right  Result Date: 01/01/2017 CLINICAL DATA:  Right wrist pain after fall at work today. EXAM: RIGHT WRIST - COMPLETE 3+ VIEW COMPARISON:  Radiographs of March 21, 2015. FINDINGS: No acute fracture or dislocation is noted. Irregularity and deformity of the distal scaphoid is again noted and unchanged, consistent with degenerative change or possibly old traumatic injury. Degenerative changes again noted between the lunate and capitate. Severe radiocarpal narrowing is also noted. Stable widening of the scapholunate  space is noted which may represent degenerative change or old injury of the scapholunate ligament. No soft tissue abnormality is noted. IMPRESSION: No acute abnormality seen in the right wrist. 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 Stable deformity and irregularity of distal scaphoid is noted suggesting degenerative change or possibly old traumatic injury. Electronically Signed   By: Marijo Conception, M.D.   On: 01/01/2017 12:16   Ct Head Wo Contrast  Result Date: 01/01/2017 CLINICAL DATA:  Blurred vision, scalp hematoma after fall at work. No loss of consciousness. EXAM: CT HEAD WITHOUT CONTRAST CT CERVICAL SPINE WITHOUT CONTRAST TECHNIQUE: Multidetector CT imaging of the head and cervical spine was performed following the standard protocol without intravenous contrast. Multiplanar CT image reconstructions of the cervical spine were also generated. COMPARISON:  CT scan of head of November 06, 2009. FINDINGS: CT HEAD FINDINGS Brain: No evidence of acute infarction, hemorrhage, hydrocephalus, extra-axial collection or mass lesion/mass effect. Vascular: No hyperdense vessel or unexpected calcification. Skull: Normal. Negative for fracture or focal lesion. Sinuses/Orbits: No acute finding. Other: None. CT CERVICAL SPINE FINDINGS Alignment: Normal. Skull base and vertebrae: No acute fracture. No primary bone lesion or focal pathologic process. Soft tissues and spinal canal: No prevertebral fluid or swelling. No visible canal hematoma. Disc levels: Disc spaces are well-maintained. Anterior osteophyte formation is noted at C3-4, C4-5, C5-6 and C6-7. Upper chest: Negative. Other: None. IMPRESSION: Normal head CT. Mild degenerative changes are noted in the cervical spine. No acute abnormality is noted. Electronically Signed   By: Marijo Conception, M.D.   On: 01/01/2017 13:05   Ct Cervical Spine Wo Contrast  Result Date: 01/01/2017 CLINICAL DATA:  Blurred vision, scalp hematoma  after fall at work. No loss of consciousness. EXAM: CT HEAD WITHOUT CONTRAST CT CERVICAL SPINE WITHOUT CONTRAST TECHNIQUE: Multidetector CT imaging of the head and cervical spine was performed following the standard protocol without intravenous contrast. Multiplanar CT image reconstructions of the cervical spine were also generated. COMPARISON:  CT scan of head of November 06, 2009. FINDINGS: CT HEAD FINDINGS Brain: No evidence of acute infarction, hemorrhage, hydrocephalus, extra-axial collection or mass lesion/mass effect. Vascular: No hyperdense vessel or unexpected calcification. Skull: Normal. Negative for fracture or focal lesion. Sinuses/Orbits: No acute finding. Other: None. CT CERVICAL SPINE FINDINGS Alignment: Normal. Skull base and vertebrae: No acute fracture. No primary bone lesion or focal pathologic process. Soft tissues and spinal canal: No prevertebral fluid or swelling. No visible canal hematoma. Disc levels: Disc spaces are well-maintained. Anterior osteophyte formation is noted at C3-4, C4-5, C5-6  and C6-7. Upper chest: Negative. Other: None. IMPRESSION: Normal head CT. Mild degenerative changes are noted in the cervical spine. No acute abnormality is noted. Electronically Signed   By: Marijo Conception, M.D.   On: 01/01/2017 13:05    Procedures Procedures (including critical care time)  Medications Ordered in ED Medications  ibuprofen (ADVIL,MOTRIN) tablet 600 mg (600 mg Oral Given 01/01/17 1335)     Initial Impression / Assessment and Plan / ED Course  I have reviewed the triage vital signs and the nursing notes.  Pertinent labs & imaging results that were available during my care of the patient were reviewed by me and considered in my medical decision making (see chart for details).     Patient presenting following fall. No acute findings on CT head, C-spine, x-rays of the back and R wrist. Patient is ambulatory in ED. No focal deficits on neuro exam. Considering anatomical  snuffbox tenderness and patient with chronic deformity and irregularity of scaphoid, we'll place in thumb spica and follow-up to him in one week. Patient states he has been in physical therapy for his right wrist in the past and had recovered well until he fell today. Patient follow-up with PCP if back pain continues. Supportive treatment discussed including ice, heat, and stretching. Discharge home with Robaxin and Naprosyn. Return precautions discussed. Patient understands and agrees to plan. Patient vitals stable throughout ED course and discharged in satisfactory condition.  Final Clinical Impressions(s) / ED Diagnoses   Final diagnoses:  Fall, initial encounter  Right wrist pain  Acute midline low back pain without sciatica    New Prescriptions New Prescriptions   METHOCARBAMOL (ROBAXIN) 500 MG TABLET    Take 1 tablet (500 mg total) by mouth 2 (two) times daily.   NAPROXEN (NAPROSYN) 375 MG TABLET    Take 1 tablet (375 mg total) by mouth 2 (two) times daily.     Frederica Kuster, PA-C 01/01/17 1348    Jola Schmidt, MD 01/01/17 1538

## 2017-01-01 NOTE — Discharge Instructions (Signed)
Medications: Robaxin, Naprosyn  Treatment: Take Robaxin twice daily as needed for muscle pain and spasms. Do not drive or operate machinery when taking this medication. Take Naprosyn twice daily as needed for pain. Use ice 3-4 times daily alternating 20 minutes on, 20 minutes off. After the third day, use heat in the same manner. Attempt the back exercises and stretches daily as tolerated. Wear your wrist splint at all times until you are seen by Dr. Fredna Dow.  Follow-up: Please follow-up with Dr. Fredna Dow, your hand doctor, for further evaluation and treatment of your wrist pain and repeat ultrasound within 1 week. Please see your primary care provider if your back pain is not improving over the next 7-10 days. Please return to emergency department if you develop any new or worsening symptoms.

## 2017-01-01 NOTE — ED Triage Notes (Signed)
Per EMS pt was at work and slipped on syrup. Pt fell backwards and attempted to catch himself with right arm and is c/o wrist pain. Pt also c/o lower back pain and has hematoma to back of head. No LOC and no blood thinners. Pt A&O  x4.

## 2017-01-26 ENCOUNTER — Other Ambulatory Visit: Payer: Self-pay | Admitting: Orthopedic Surgery

## 2017-01-26 DIAGNOSIS — M542 Cervicalgia: Secondary | ICD-10-CM

## 2017-01-26 DIAGNOSIS — M545 Low back pain: Secondary | ICD-10-CM

## 2017-02-10 ENCOUNTER — Ambulatory Visit
Admission: RE | Admit: 2017-02-10 | Discharge: 2017-02-10 | Disposition: A | Discharge status: Home / Self Care | Payer: Worker's Compensation | Source: Ambulatory Visit | Attending: Orthopedic Surgery | Admitting: Orthopedic Surgery

## 2017-02-10 DIAGNOSIS — M545 Low back pain: Secondary | ICD-10-CM

## 2017-02-10 DIAGNOSIS — M542 Cervicalgia: Secondary | ICD-10-CM

## 2017-03-15 ENCOUNTER — Other Ambulatory Visit: Payer: Self-pay | Admitting: Orthopedic Surgery

## 2017-03-15 DIAGNOSIS — M25561 Pain in right knee: Principal | ICD-10-CM

## 2017-03-15 DIAGNOSIS — M25461 Effusion, right knee: Secondary | ICD-10-CM

## 2017-03-18 ENCOUNTER — Ambulatory Visit
Admission: RE | Admit: 2017-03-18 | Discharge: 2017-03-18 | Disposition: A | Discharge status: Home / Self Care | Payer: Worker's Compensation | Source: Ambulatory Visit | Attending: Orthopedic Surgery | Admitting: Orthopedic Surgery

## 2017-03-18 ENCOUNTER — Other Ambulatory Visit: Payer: Self-pay

## 2017-03-18 DIAGNOSIS — M25561 Pain in right knee: Principal | ICD-10-CM

## 2017-03-18 DIAGNOSIS — M25461 Effusion, right knee: Secondary | ICD-10-CM

## 2017-05-08 ENCOUNTER — Telehealth: Payer: Self-pay | Admitting: Internal Medicine

## 2017-05-08 MED ORDER — LISINOPRIL-HYDROCHLOROTHIAZIDE 20-25 MG PO TABS: 1.0000 | ORAL_TABLET | Freq: Every day | ORAL | 1 refills | Status: DC

## 2017-05-08 MED ORDER — AMLODIPINE BESYLATE 5 MG PO TABS: 5.0000 mg | ORAL_TABLET | Freq: Every day | ORAL | 1 refills | Status: DC

## 2017-05-08 NOTE — Telephone Encounter (Signed)
Patient has made appt for when Tristan Parker gets back.  He will be out of medication before then.  Patient has had a bad fall at work and has had surgery on his leg and arm.  He has other follow up appts in regard to this up until the first of October.

## 2017-05-08 NOTE — Telephone Encounter (Signed)
This has been sent in. Can you inform patient please?

## 2017-05-08 NOTE — Telephone Encounter (Signed)
Pt called checking on this. Please advise.

## 2017-05-08 NOTE — Telephone Encounter (Signed)
Patient last seen in November.  Patient only available time to be seen is first week of October which Ronnald Ramp is out.  Patient would like to know if Dr. Ronnald Ramp can send any refills of his BP med to [facility] on [address]?  Please update this pharmacy as patients primary pharmacy.

## 2017-05-09 NOTE — Telephone Encounter (Signed)
Informed pt .

## 2017-06-13 ENCOUNTER — Other Ambulatory Visit (INDEPENDENT_AMBULATORY_CARE_PROVIDER_SITE_OTHER): Payer: 59

## 2017-06-13 ENCOUNTER — Ambulatory Visit (INDEPENDENT_AMBULATORY_CARE_PROVIDER_SITE_OTHER): Payer: 59 | Admitting: Internal Medicine

## 2017-06-13 ENCOUNTER — Encounter: Payer: Self-pay | Admitting: Internal Medicine

## 2017-06-13 VITALS — BP 130/90 | HR 90 | Temp 98.4°F | Resp 16 | Ht 67.0 in | Wt 201.0 lb

## 2017-06-13 DIAGNOSIS — I10 Essential (primary) hypertension: Secondary | ICD-10-CM

## 2017-06-13 DIAGNOSIS — R7303 Prediabetes: Secondary | ICD-10-CM | POA: Diagnosis not present

## 2017-06-13 DIAGNOSIS — Z720 Tobacco use: Secondary | ICD-10-CM

## 2017-06-13 DIAGNOSIS — E781 Pure hyperglyceridemia: Secondary | ICD-10-CM | POA: Insufficient documentation

## 2017-06-13 DIAGNOSIS — E785 Hyperlipidemia, unspecified: Secondary | ICD-10-CM

## 2017-06-13 DIAGNOSIS — Z23 Encounter for immunization: Secondary | ICD-10-CM | POA: Diagnosis not present

## 2017-06-13 DIAGNOSIS — R945 Abnormal results of liver function studies: Secondary | ICD-10-CM | POA: Diagnosis not present

## 2017-06-13 DIAGNOSIS — R7989 Other specified abnormal findings of blood chemistry: Secondary | ICD-10-CM

## 2017-06-13 DIAGNOSIS — E118 Type 2 diabetes mellitus with unspecified complications: Secondary | ICD-10-CM

## 2017-06-13 LAB — HEPATIC FUNCTION PANEL
ALT: 33 U/L (ref 0–53)
AST: 30 U/L (ref 0–37)
Albumin: 4.3 g/dL (ref 3.5–5.2)
Alkaline Phosphatase: 40 U/L (ref 39–117)
BILIRUBIN DIRECT: 0.1 mg/dL (ref 0.0–0.3)
BILIRUBIN TOTAL: 0.4 mg/dL (ref 0.2–1.2)
TOTAL PROTEIN: 7 g/dL (ref 6.0–8.3)

## 2017-06-13 LAB — LDL CHOLESTEROL, DIRECT: LDL DIRECT: 133 mg/dL

## 2017-06-13 LAB — BASIC METABOLIC PANEL
BUN: 10 mg/dL (ref 6–23)
CO2: 24 meq/L (ref 19–32)
Calcium: 9.6 mg/dL (ref 8.4–10.5)
Chloride: 102 mEq/L (ref 96–112)
Creatinine, Ser: 0.8 mg/dL (ref 0.40–1.50)
GFR: 129.05 mL/min (ref >60.00)
Glucose, Bld: 113 mg/dL — ABNORMAL HIGH (ref 70–99)
POTASSIUM: 3.6 meq/L (ref 3.5–5.1)
Sodium: 138 mEq/L (ref 135–145)

## 2017-06-13 LAB — LIPID PANEL
CHOL/HDL RATIO: 6
Cholesterol: 196 mg/dL (ref 0–200)
HDL: 32.5 mg/dL — ABNORMAL LOW (ref >39.00)
NonHDL: 163.61
Triglycerides: 258 mg/dL — ABNORMAL HIGH (ref 0.0–149.0)
VLDL: 51.6 mg/dL — AB (ref 0.0–40.0)

## 2017-06-13 LAB — HEMOGLOBIN A1C: Hgb A1c MFr Bld: 6.6 % — ABNORMAL HIGH (ref 4.6–6.5)

## 2017-06-13 MED ORDER — LISINOPRIL-HYDROCHLOROTHIAZIDE 20-25 MG PO TABS: 1.0000 | ORAL_TABLET | Freq: Every day | ORAL | 1 refills | Status: DC

## 2017-06-13 MED ORDER — ROSUVASTATIN CALCIUM 20 MG PO TABS: 20.0000 mg | ORAL_TABLET | Freq: Every day | ORAL | 1 refills | Status: AC

## 2017-06-13 MED ORDER — AMLODIPINE BESYLATE 5 MG PO TABS: 5.0000 mg | ORAL_TABLET | Freq: Every day | ORAL | 1 refills | Status: DC

## 2017-06-13 NOTE — Progress Notes (Signed)
Subjective:  Patient ID: Tristan Parker, male    DOB: 04/04/1961  Age: 56 y.o. MRN: [id number]  CC: Hypertension; Hyperlipidemia; and Diabetes   HPI [facility] presents for f/up - He wants to change pravastatin to Crestor. He's heard it's more effective. He feels well today and offers no complaints. He has a history of positive hepatitis B core antibody but negative hepatitis B surface antibody. He has never had an episode of hepatitis that he is aware of. He wants to be screened for lung cancer.  Outpatient Medications Prior to Visit  Medication Sig Dispense Refill  . amLODipine (NORVASC) 5 MG tablet Take 1 tablet (5 mg total) by mouth daily. 30 tablet 1  . lisinopril-hydrochlorothiazide (PRINZIDE,ZESTORETIC) 20-25 MG tablet Take 1 tablet by mouth daily. 30 tablet 1  . methocarbamol (ROBAXIN) 500 MG tablet Take 1 tablet (500 mg total) by mouth 2 (two) times daily. 20 tablet 0  . naproxen (NAPROSYN) 375 MG tablet Take 1 tablet (375 mg total) by mouth 2 (two) times daily. 20 tablet 0  . pravastatin (PRAVACHOL) 20 MG tablet Take 1 tablet (20 mg total) by mouth daily. 90 tablet 1   No facility-administered medications prior to visit.     ROS Review of Systems  Constitutional: Negative.  Negative for diaphoresis and fatigue.  HENT: Negative.  Negative for sore throat.   Eyes: Negative for visual disturbance.  Respiratory: Negative.  Negative for cough, chest tightness, shortness of breath and wheezing.   Cardiovascular: Negative for chest pain, palpitations and leg swelling.  Gastrointestinal: Negative for abdominal pain, constipation, diarrhea, nausea and vomiting.  Endocrine: Negative.   Genitourinary: Negative.  Negative for difficulty urinating.  Musculoskeletal: Negative.  Negative for arthralgias, joint swelling and myalgias.  Skin: Negative.  Negative for color change.  Allergic/Immunologic: Negative.   Neurological: Negative.  Negative for dizziness.  Hematological:  Negative for adenopathy. Does not bruise/bleed easily.  Psychiatric/Behavioral: Negative.     Objective:  BP 130/90 (BP Location: Left Arm, Patient Position: Sitting, Cuff Size: Normal)   Pulse 90   Temp 98.4 F (36.9 C) (Oral)   Resp 16   Ht 5\' 7"  (1.702 m)   Wt 201 lb (91.2 kg)   SpO2 94%   BMI 31.48 kg/m   BP Readings from Last 3 Encounters:  06/13/17 130/90  01/01/17 (!) 154/130  07/06/16 114/80    Wt Readings from Last 3 Encounters:  06/13/17 201 lb (91.2 kg)  01/01/17 185 lb (83.9 kg)  07/06/16 185 lb 8 oz (84.1 kg)    Physical Exam  Constitutional: He is oriented to person, place, and time. No distress.  HENT:  Mouth/Throat: Oropharynx is clear and moist. No oropharyngeal exudate.  Eyes: Conjunctivae are normal. Right eye exhibits no discharge. Left eye exhibits no discharge. No scleral icterus.  Neck: Normal range of motion. Neck supple. No JVD present. No thyromegaly present.  Cardiovascular: Normal rate, regular rhythm and intact distal pulses.  Exam reveals no gallop and no friction rub.   No murmur heard. Pulmonary/Chest: Effort normal and breath sounds normal. No respiratory distress. He has no wheezes. He has no rales. He exhibits no tenderness.  Abdominal: Soft. Bowel sounds are normal. He exhibits no distension and no mass. There is no tenderness. There is no rebound and no guarding.  Musculoskeletal: Normal range of motion. He exhibits no edema, tenderness or deformity.  Lymphadenopathy:    He has no cervical adenopathy.  Neurological: He is alert and oriented to  person, place, and time.  Skin: Skin is warm and dry. No rash noted. He is not diaphoretic. No erythema. No pallor.  Vitals reviewed.   Lab Results  Component Value Date   WBC 7.5 07/06/2016   HGB 15.8 07/06/2016   HCT 46.4 07/06/2016   PLT 262.0 07/06/2016   GLUCOSE 113 (H) 06/13/2017   CHOL 196 06/13/2017   TRIG 258.0 (H) 06/13/2017   HDL 32.50 (L) 06/13/2017   LDLDIRECT 133.0  06/13/2017   LDLCALC 85 01/17/2013   ALT 33 06/13/2017   AST 30 06/13/2017   NA 138 06/13/2017   K 3.6 06/13/2017   CL 102 06/13/2017   CREATININE 0.80 06/13/2017   BUN 10 06/13/2017   CO2 24 06/13/2017   TSH 2.12 06/13/2017   PSA 2.71 07/06/2016   HGBA1C 6.6 (H) 06/13/2017    Mr Knee Right Wo Contrast  Result Date: 03/18/2017 CLINICAL DATA:  Pain and swelling of the right knee after fall on 01/01/2017 at work. EXAM: MRI OF THE RIGHT KNEE WITHOUT CONTRAST TECHNIQUE: Multiplanar, multisequence MR imaging of the knee was performed. No intravenous contrast was administered. COMPARISON:  None. FINDINGS: MENISCI Medial meniscus: Horizontal cleavage tear of the posterior body and horn of the medial meniscus, image 7/ ser. 5 and image 6/ ser.7. Subluxation of the body of the medial meniscus by 3 mm, image 8/series 4 raising the possibility of a tear of the posterior meniscal root given elevated signal intensity seen near its tibial attachment, image 4/ series 4. Lateral meniscus:  Intact. LIGAMENTS Cruciates:  Intact ACL and PCL. Collaterals: Medial collateral ligament is intact. Lateral collateral ligament complex is intact. CARTILAGE Patellofemoral: Small fissure involving the cartilage overlying the medial patellar facet, image 10/3. Medial: Irregular thinning of the femoral condylar and tibial plateau cartilage with tiny near full thickness focal chondral defect along the weight-bearing portion of the femoral condylar cartilage measuring 2 mm in diameter, image 10/4 and 16/7. Lateral:  No chondral defect Joint: Small joint effusion. Normal Hoffa's fat. No plical thickening. Popliteal Fossa:  No Baker cyst. Intact popliteus tendon. Extensor Mechanism:  Intact quadriceps tendon and patellar tendon. Bones: No focal marrow signal abnormality. No fracture or dislocation. Other: None. IMPRESSION: 1. Horizontal cleavage tear involving the posterior body and horn of the medial meniscus with possible tear at  the meniscal root causing up to 3 mm of subluxation of the body of the medial meniscus. 2. Tiny focal chondral defect of the medial femoral condylar cartilage measuring 2 mm in diameter. Mild irregular cartilaginous thinning of the medial femorotibial compartment. 3. Focal linear fissure within the medial patellar cartilage. Electronically Signed   By: Ashley Royalty M.D.   On: 03/18/2017 22:03    Assessment & Plan:   Kire was seen today for hypertension, hyperlipidemia and diabetes.  Diagnoses and all orders for this visit:  Need for influenza vaccination -     Flu Vaccine QUAD 36+ mos IM  Elevated LFTs- his liver enzymes are normal. Hepatitis B surface antigen is negative indicating no active infection. -     Hepatitis B surface antigen; Future -     Hepatic function panel; Future  Prediabetes -     Basic metabolic panel; Future -     Hemoglobin A1c; Future  Essential hypertension, benign- his blood pressure is well controlled. Labs are negative for secondary causes or end organ damage. Will continue the current combination of an ACEI, CCB, and thiazide diuretic. -     Basic metabolic  panel; Future -     Thyroid Panel With TSH; Future  Hyperlipidemia with target low density lipoprotein (LDL) cholesterol less than 130 mg/dL- he has not achieved his LDL goal. We'll change to Crestor at his request. -     rosuvastatin (CRESTOR) 20 MG tablet; Take 1 tablet (20 mg total) by mouth daily. -     Thyroid Panel With TSH; Future  Pure hyperglyceridemia- his triglycerides are mildly elevated but do not require medical therapy at this time. -     Lipid panel; Future  Type 2 diabetes mellitus with complication, without long-term current use of insulin ([place])- his A1c is up to 6.6%. He has new onset type 2 diabetes mellitus. Medical therapy is not indicated.  Tobacco abuse -     Ambulatory Referral for Lung Cancer Scre  Other orders -     amLODipine (NORVASC) 5 MG tablet; Take 1 tablet (5 mg  total) by mouth daily. -     lisinopril-hydrochlorothiazide (PRINZIDE,ZESTORETIC) 20-25 MG tablet; Take 1 tablet by mouth daily.   I have discontinued Mr. Francella Solian Hilaire's pravastatin, methocarbamol, and naproxen. I am also having him start on rosuvastatin. Additionally, I am having him maintain his amLODipine and lisinopril-hydrochlorothiazide.  Meds ordered this encounter  Medications  . rosuvastatin (CRESTOR) 20 MG tablet    Sig: Take 1 tablet (20 mg total) by mouth daily.    Dispense:  90 tablet    Refill:  1  . amLODipine (NORVASC) 5 MG tablet    Sig: Take 1 tablet (5 mg total) by mouth daily.    Dispense:  90 tablet    Refill:  1  . lisinopril-hydrochlorothiazide (PRINZIDE,ZESTORETIC) 20-25 MG tablet    Sig: Take 1 tablet by mouth daily.    Dispense:  90 tablet    Refill:  1     Follow-up: Return in about 4 months (around 10/14/2017).  Scarlette Calico, MD

## 2017-06-13 NOTE — Patient Instructions (Signed)

## 2017-06-14 ENCOUNTER — Encounter: Payer: Self-pay | Admitting: Internal Medicine

## 2017-06-14 DIAGNOSIS — E118 Type 2 diabetes mellitus with unspecified complications: Secondary | ICD-10-CM | POA: Insufficient documentation

## 2017-06-14 LAB — THYROID PANEL WITH TSH
Free Thyroxine Index: 2.1 (ref 1.4–3.8)
T3 UPTAKE: 33 % (ref 22–35)
T4 TOTAL: 6.4 ug/dL (ref 4.9–10.5)
TSH: 2.12 mIU/L (ref 0.40–4.50)

## 2017-06-14 LAB — HEPATITIS B SURFACE ANTIGEN: Hepatitis B Surface Ag: NONREACTIVE

## 2017-06-15 DIAGNOSIS — Z720 Tobacco use: Secondary | ICD-10-CM | POA: Insufficient documentation

## 2017-06-20 ENCOUNTER — Telehealth: Payer: Self-pay | Admitting: Acute Care

## 2017-06-22 NOTE — Telephone Encounter (Signed)
Will forward to the lung nodule pool 

## 2017-06-22 NOTE — Telephone Encounter (Signed)
Spoke with pt to schedule SDMV and CT.  Pt has less than 30 pack /year smoking history so he does not qualify for lung cancer screening.  Pt is aware that referral has been cancelled.  Letter sent to Dr Ronnald Ramp to make him aware.

## 2017-10-17 ENCOUNTER — Ambulatory Visit: Payer: Self-pay | Admitting: Internal Medicine

## 2017-10-17 DIAGNOSIS — Z0289 Encounter for other administrative examinations: Secondary | ICD-10-CM

## 2017-11-25 IMAGING — MR MR LUMBAR SPINE W/O CM
4 of 5 series · 26 of 48 positions shown · non-contrast
Comparison: Lumbar spine radiographs 01/01/2017

CLINICAL DATA: Chronic low back pain.  Posterior right leg pain.

EXAM:
MRI LUMBAR SPINE WITHOUT CONTRAST
TECHNIQUE: Multiplanar, multisequence MR imaging of the lumbar spine was
performed. No intravenous contrast was administered.

[Series 3: T2 · sagittal · 4.0mm · 0.55mm/px · 6 of 15 slices shown (1 of 2)]
[im 1/15]
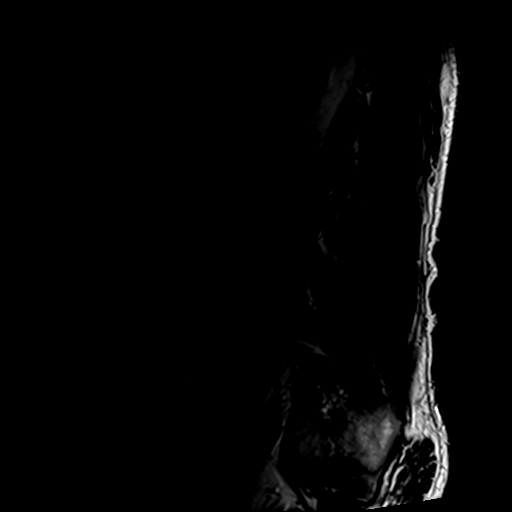
[im 3/15]
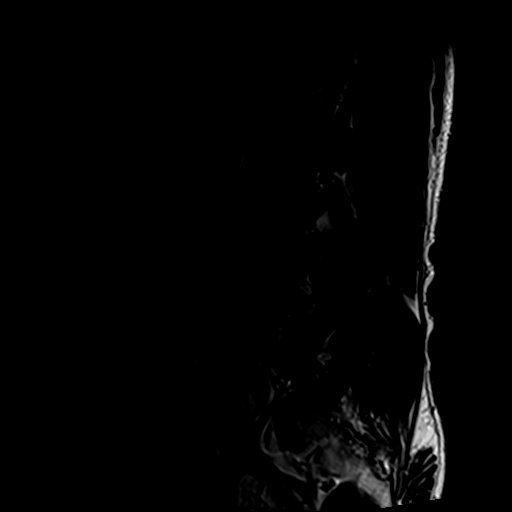
[im 6/15]
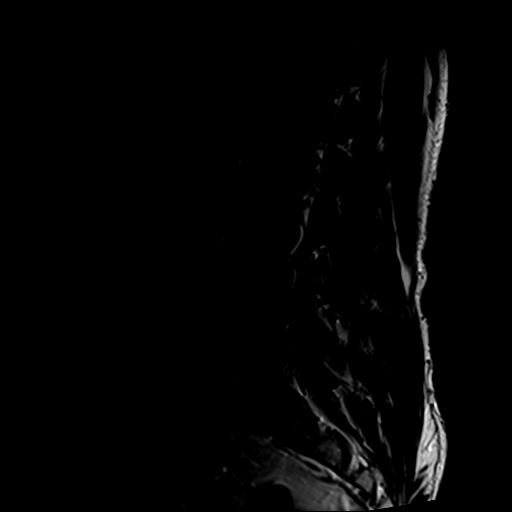
[im 9/15]
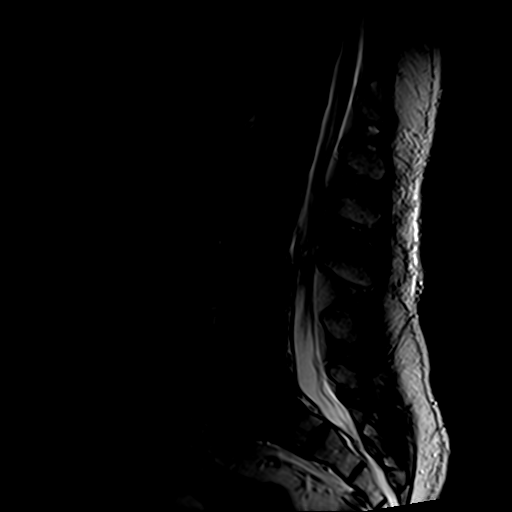
[im 12/15]
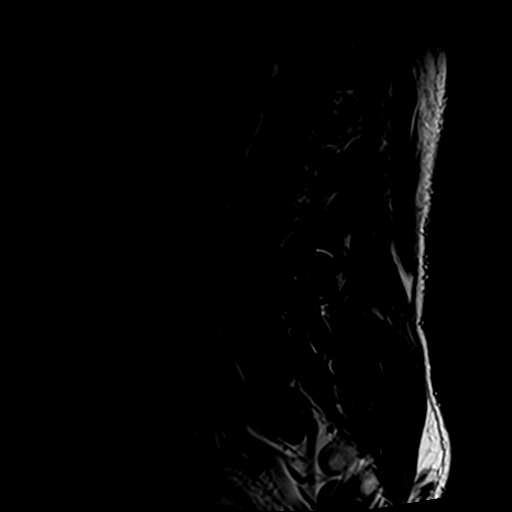
[im 15/15]
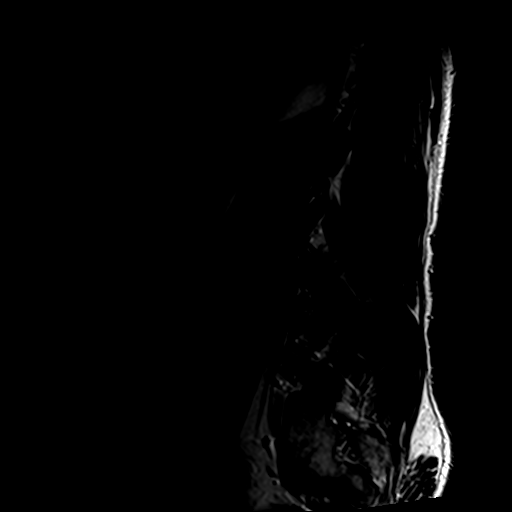

[Series 4: T1 · sagittal · 4.0mm · 0.55mm/px · 6 of 15 slices shown (1 of 2)]
[im 1/15]
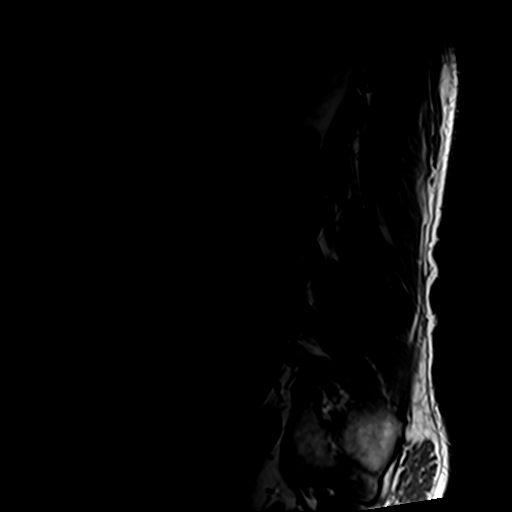
[im 3/15]
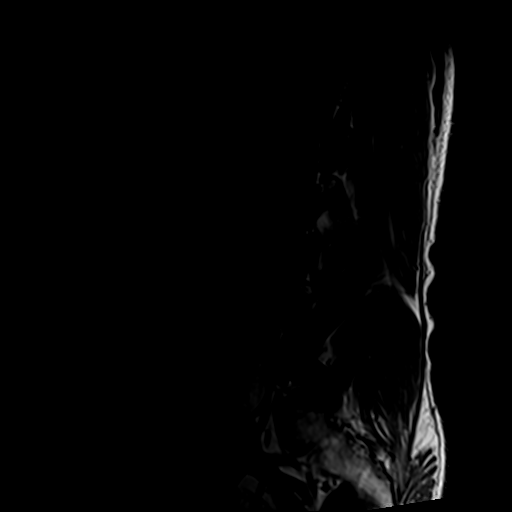
[im 6/15]
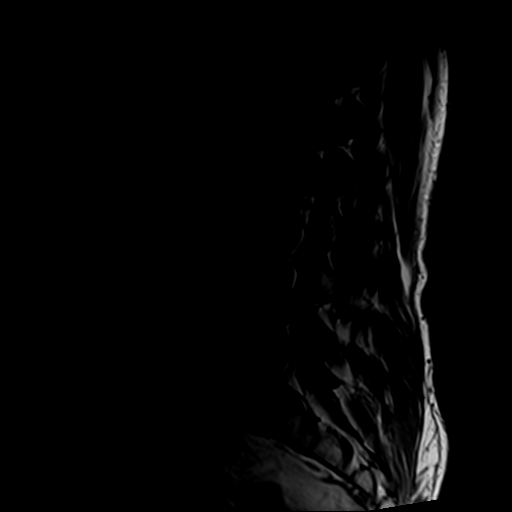
[im 9/15]
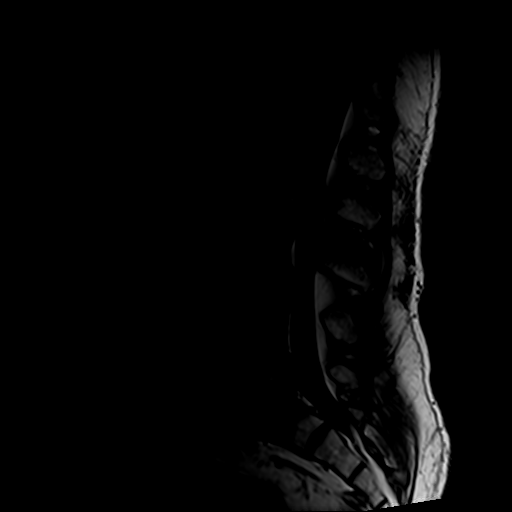
[im 12/15]
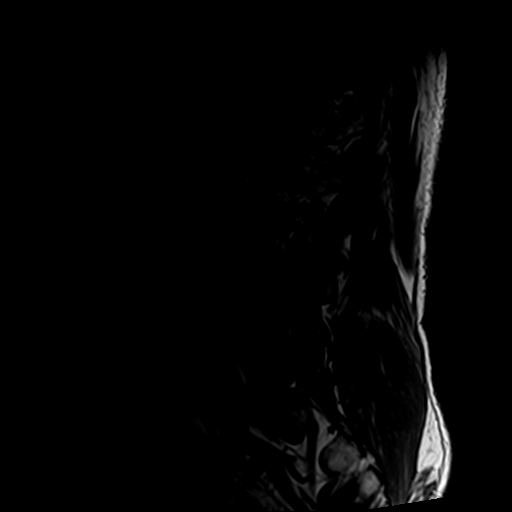
[im 15/15]
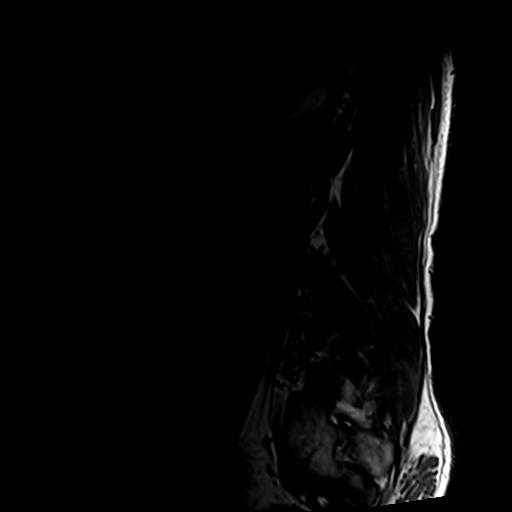

[Series 6: T2 · axial · 4.0mm · 0.70mm/px · z∈[-67,+147]mm · 9 of 39 slices shown (2 of 2)]
[im 1/39]
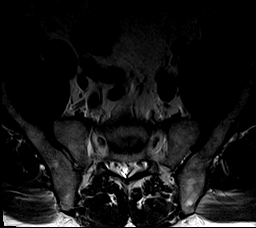
[im 6/39]
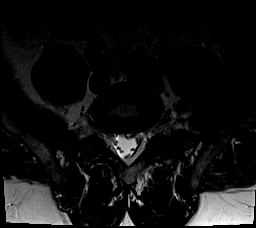
[im 11/39]
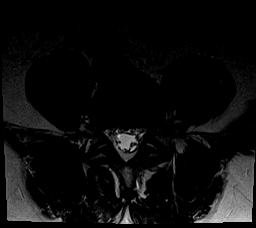
[im 17/39]
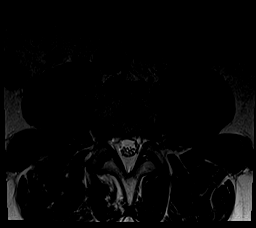
[im 20/39]
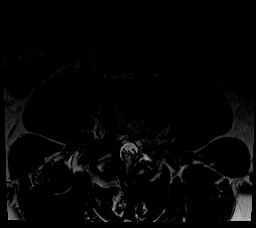
[im 22/39]
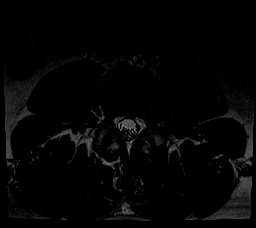
[im 28/39]
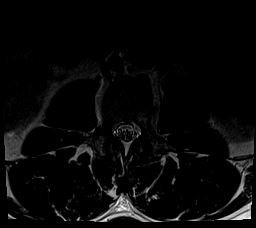
[im 33/39]
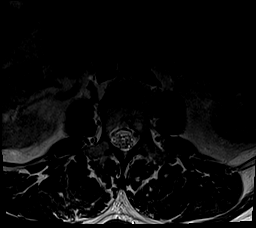
[im 39/39]
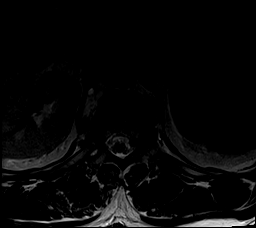

[Series 7: T1 · axial · 4.0mm · 0.35mm/px · z∈[-67,+118]mm · 5 of 39 slices shown (2 of 2)]
[im 1/39]
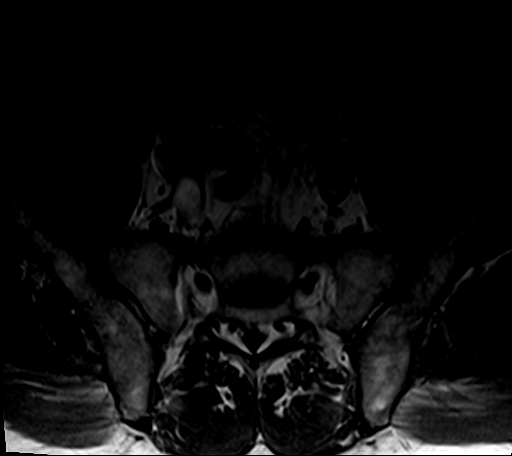
[im 6/39]
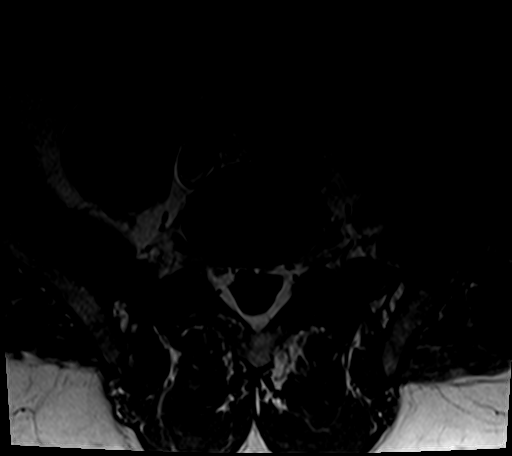
[im 11/39]
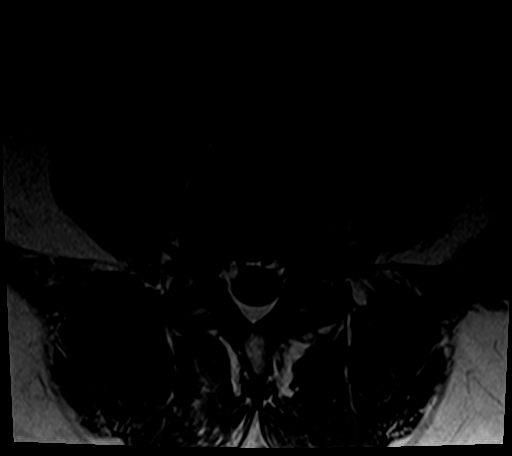
[im 20/39]
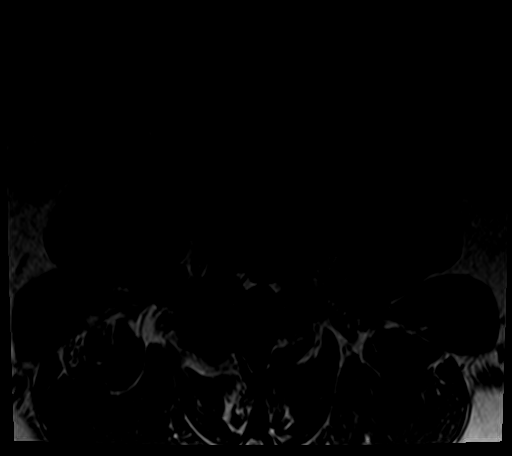
[im 33/39]
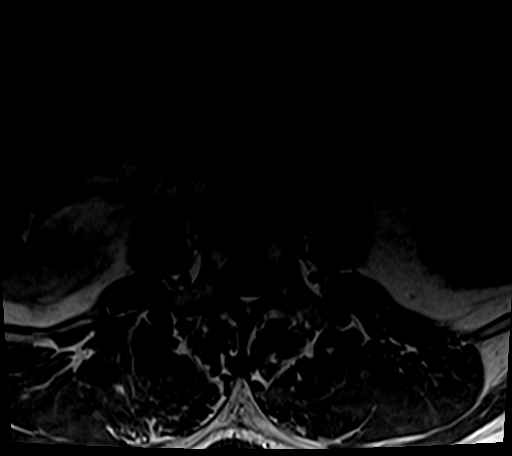

[26 of 48 positions shown; findings below may reference images not displayed]

FINDINGS: Segmentation:  Standard.

Alignment: 4 mm anterolisthesis of L3 on L4 due to chronic bilateral
L3 pars defects. Straightening of the normal lumbar lordosis. Mild
lumbar levoscoliosis.

Vertebrae: No evidence of acute fracture. No suspicious osseous
lesion. Mild degenerative endplate edema centric to the right at
L3-4.

Conus medullaris: Extends to the upper L1 level and appears normal.

Paraspinal and other soft tissues: Unremarkable.

Disc levels:

T12-L1 and L1-2:  Negative.

L2-3: Normal disc. Epidural lipomatosis and mild facet hypertrophy.
No stenosis.

L3-4: L3 pars defects containing a small amount of fluid
bilaterally. Disc desiccation and moderate disc space narrowing.
Anterolisthesis with bulging uncovered disc and mild facet
hypertrophy result in moderate to severe right neural foraminal
stenosis with potential right L3 nerve root impingement. Epidural
lipomatosis without spinal stenosis.

L4-5: Disc desiccation. Minimal disc bulging and mild facet
hypertrophy result in minimal right and mild-to-moderate left neural
foraminal stenosis. Epidural lipomatosis without spinal stenosis.

L5-S1: Normal disc. Epidural lipomatosis and slight facet
hypertrophy without stenosis.
IMPRESSION: 1. Chronic L3 pars defects with grade 1 anterolisthesis and moderate
disc degeneration. Moderate to severe right neural foraminal
stenosis with potential L3 nerve impingement.
2. Mild-to-moderate left neural foraminal stenosis at L4-5.
3. Epidural lipomatosis throughout the lumbar spine. No spinal
stenosis.

## 2019-07-30 ENCOUNTER — Ambulatory Visit (HOSPITAL_COMMUNITY)
Admission: EM | Admit: 2019-07-30 | Discharge: 2019-07-30 | Disposition: A | Discharge status: Home / Self Care | Payer: BC Managed Care – PPO | Attending: Family Medicine | Admitting: Family Medicine

## 2019-07-30 ENCOUNTER — Encounter (HOSPITAL_COMMUNITY): Payer: Self-pay

## 2019-07-30 ENCOUNTER — Other Ambulatory Visit: Payer: Self-pay

## 2019-07-30 DIAGNOSIS — Z630 Problems in relationship with spouse or partner: Secondary | ICD-10-CM

## 2019-07-30 DIAGNOSIS — I10 Essential (primary) hypertension: Secondary | ICD-10-CM | POA: Diagnosis not present

## 2019-07-30 DIAGNOSIS — E782 Mixed hyperlipidemia: Secondary | ICD-10-CM

## 2019-07-30 DIAGNOSIS — R0789 Other chest pain: Secondary | ICD-10-CM | POA: Diagnosis not present

## 2019-07-30 MED ORDER — FAMOTIDINE 20 MG PO TABS: 20.0000 mg | ORAL_TABLET | Freq: Two times a day (BID) | ORAL | 0 refills | Status: AC

## 2019-07-30 NOTE — ED Triage Notes (Signed)
Patient presents to Urgent Care with complaints of intermittent chest pain since 5-7 days ago. Patient reports sometimes he feels it when he is active at work, other times it is when he sleeps a funny way at night.

## 2019-07-30 NOTE — ED Provider Notes (Signed)
[facility]   MRN: [id number] DOB: 12-31-1960  Subjective:   Tristan Parker is a 58 y.o. male presenting for 3 to 5-day history of intermittent moderate lower chest pain.  Patient states that symptoms occur randomly, has 1-2 episodes per day, last about 15 minutes per episode and relief is obtained by becoming occupied.  States that he has been under a lot of marital stress since his wife told him she wanted a divorce which happened this past week.  He does have risk factors of HTN, HL and is on medications for this.  Patient has a history of smoking, quit 3 years ago.  He also has a history of prediabetes, no current medication for this.  Patient has never seen a cardiologist.  He does have a history of GERD but is not taking anything for this.  Tries to practice healthy diet.  No current facility-administered medications for this encounter.   Current Outpatient Medications:  .  olmesartan (BENICAR) 40 MG tablet, Take 40 mg by mouth daily., Disp: , Rfl:  .  rosuvastatin (CRESTOR) 20 MG tablet, Take 1 tablet (20 mg total) by mouth daily., Disp: 90 tablet, Rfl: 1   No Known Allergies  Past Medical History:  Diagnosis Date  . GERD (gastroesophageal reflux disease)   . Hyperlipemia   . Hypertension      Past Surgical History:  Procedure Laterality Date  . FOREIGN BODY REMOVAL     as a child from leg  . None      Family History  Problem Relation Age of Onset  . Stroke Mother   . Stroke Father   . Hypertension Father   . Cancer Neg Hx   . Alcohol abuse Neg Hx   . Asthma Neg Hx   . COPD Neg Hx   . Depression Neg Hx   . Diabetes Neg Hx   . Drug abuse Neg Hx   . Early death Neg Hx   . Hearing loss Neg Hx   . Heart disease Neg Hx   . Hyperlipidemia Neg Hx   . Kidney disease Neg Hx     Social History   Tobacco Use  . Smoking status: Former Smoker    Packs/day: 0.25    Years: 25.00    Pack years: 6.25    Types: Cigarettes    Quit date: 07/29/2016    Years  since quitting: 3.0  . Smokeless tobacco: Never Used  Substance Use Topics  . Alcohol use: Yes    Alcohol/week: 14.0 standard drinks    Types: 14 Cans of beer per week  . Drug use: No    Review of Systems  Constitutional: Negative for fever and malaise/fatigue.  HENT: Negative for congestion, ear pain, sinus pain and sore throat.   Eyes: Negative for blurred vision, double vision, discharge and redness.  Respiratory: Negative for cough, hemoptysis, shortness of breath and wheezing.   Cardiovascular: Positive for chest pain.  Gastrointestinal: Negative for abdominal pain, diarrhea, nausea and vomiting.  Genitourinary: Negative for dysuria, flank pain and hematuria.  Musculoskeletal: Negative for myalgias.  Skin: Negative for rash.  Neurological: Negative for dizziness, focal weakness, weakness and headaches.  Psychiatric/Behavioral: Negative for depression and substance abuse. The patient is nervous/anxious (from marital issues).      Objective:   Vitals: BP 119/85 (BP Location: Left Arm)   Pulse 73   Temp 98.8 F (37.1 C) (Oral)   Resp 16   SpO2 100%   Physical Exam Constitutional:  General: He is not in acute distress.    Appearance: Normal appearance. He is well-developed. He is not ill-appearing, toxic-appearing or diaphoretic.  HENT:     Head: Normocephalic and atraumatic.     Right Ear: External ear normal.     Left Ear: External ear normal.     Nose: Nose normal.     Mouth/Throat:     Mouth: Mucous membranes are moist.     Pharynx: Oropharynx is clear.  Eyes:     General: No scleral icterus.    Extraocular Movements: Extraocular movements intact.     Pupils: Pupils are equal, round, and reactive to light.  Neck:     Musculoskeletal: Normal range of motion and neck supple. No neck rigidity or muscular tenderness.     Vascular: No carotid bruit.  Cardiovascular:     Rate and Rhythm: Normal rate and regular rhythm.     Heart sounds: Normal heart sounds.  No murmur. No friction rub. No gallop.   Pulmonary:     Effort: Pulmonary effort is normal. No respiratory distress.     Breath sounds: Normal breath sounds. No stridor. No wheezing, rhonchi or rales.  Chest:     Chest wall: No tenderness.  Abdominal:     General: Bowel sounds are normal. There is no distension.     Palpations: Abdomen is soft. There is no mass.     Tenderness: There is no abdominal tenderness. There is no guarding or rebound.  Musculoskeletal: Normal range of motion.     Right lower leg: No edema.     Left lower leg: No edema.  Lymphadenopathy:     Cervical: No cervical adenopathy.  Skin:    General: Skin is warm and dry.  Neurological:     Mental Status: He is alert and oriented to person, place, and time.  Psychiatric:        Mood and Affect: Mood normal.        Behavior: Behavior normal.        Thought Content: Thought content normal.        Judgment: Judgment normal.     ED ECG REPORT   Date: 07/30/2019  Rate: 100bpm  Rhythm: normal sinus rhythm  QRS Axis: normal  Intervals: normal  ST/T Wave abnormalities: normal  Conduction Disutrbances:none  Narrative Interpretation: Sinus rhythm at 100 bpm, unchanged from previous EKG on 07/06/2016.  Old EKG Reviewed: unchanged  I have personally reviewed the EKG tracing and agree with the computerized printout as noted.   Assessment and Plan :   1. Atypical chest pain   2. Marital stress   3. Essential hypertension   4. Mixed hyperlipidemia     Patient denies any current active chest pain.  EKG is unchanged from 2017, case reviewed with Dr. Mannie Stabile.  Will have patient start on Pepcid to address possible GERD.  Recommended he contact Dr. Einar Gip for an urgent cardiology consult.  Also recommended patient start counseling for his current separation from his wife. Counseled patient on potential for adverse effects with medications prescribed/recommended today, strict ER and return-to-clinic precautions discussed,  patient verbalized understanding.    Jaynee Eagles, [place] 07/30/19 [phone]

## 2019-07-30 NOTE — Discharge Instructions (Addendum)
We will manage your symptoms for atypical chest pain, acid reflux. However, there is a possibility that your symptoms may change or worsen as some emergencies develop over time. If your chest pain recurs and you have sweating, belly pain, shortness of breath, nausea, vomiting, then call 911 as it may be a life threatening event.

## 2020-10-21 ENCOUNTER — Emergency Department (HOSPITAL_COMMUNITY): Payer: BC Managed Care – PPO

## 2020-10-21 ENCOUNTER — Encounter (HOSPITAL_COMMUNITY): Payer: Self-pay | Admitting: Emergency Medicine

## 2020-10-21 ENCOUNTER — Emergency Department (HOSPITAL_COMMUNITY)
Admission: EM | Admit: 2020-10-21 | Discharge: 2020-10-21 | Disposition: A | Discharge status: Home / Self Care | Payer: BC Managed Care – PPO | Attending: Emergency Medicine | Admitting: Emergency Medicine

## 2020-10-21 DIAGNOSIS — M25511 Pain in right shoulder: Secondary | ICD-10-CM | POA: Diagnosis not present

## 2020-10-21 DIAGNOSIS — W01198A Fall on same level from slipping, tripping and stumbling with subsequent striking against other object, initial encounter: Secondary | ICD-10-CM | POA: Diagnosis not present

## 2020-10-21 DIAGNOSIS — M545 Low back pain, unspecified: Secondary | ICD-10-CM | POA: Diagnosis not present

## 2020-10-21 DIAGNOSIS — E1169 Type 2 diabetes mellitus with other specified complication: Secondary | ICD-10-CM | POA: Diagnosis not present

## 2020-10-21 DIAGNOSIS — I10 Essential (primary) hypertension: Secondary | ICD-10-CM | POA: Insufficient documentation

## 2020-10-21 DIAGNOSIS — Y9301 Activity, walking, marching and hiking: Secondary | ICD-10-CM | POA: Diagnosis not present

## 2020-10-21 DIAGNOSIS — E7800 Pure hypercholesterolemia, unspecified: Secondary | ICD-10-CM | POA: Insufficient documentation

## 2020-10-21 DIAGNOSIS — M546 Pain in thoracic spine: Secondary | ICD-10-CM | POA: Insufficient documentation

## 2020-10-21 DIAGNOSIS — W19XXXA Unspecified fall, initial encounter: Secondary | ICD-10-CM

## 2020-10-21 DIAGNOSIS — M542 Cervicalgia: Secondary | ICD-10-CM | POA: Insufficient documentation

## 2020-10-21 DIAGNOSIS — Y92512 Supermarket, store or market as the place of occurrence of the external cause: Secondary | ICD-10-CM | POA: Insufficient documentation

## 2020-10-21 DIAGNOSIS — R519 Headache, unspecified: Secondary | ICD-10-CM | POA: Diagnosis not present

## 2020-10-21 DIAGNOSIS — Z79899 Other long term (current) drug therapy: Secondary | ICD-10-CM | POA: Diagnosis not present

## 2020-10-21 DIAGNOSIS — Z87891 Personal history of nicotine dependence: Secondary | ICD-10-CM | POA: Diagnosis not present

## 2020-10-21 MED ORDER — CYCLOBENZAPRINE HCL 10 MG PO TABS
10.0000 mg | ORAL_TABLET | Freq: Once | ORAL | Status: AC
  Administered 2020-10-21: 10 mg via ORAL
  Filled 2020-10-21: qty 1

## 2020-10-21 MED ORDER — CYCLOBENZAPRINE HCL 10 MG PO TABS: 10.0000 mg | ORAL_TABLET | Freq: Every day | ORAL | 0 refills | Status: AC

## 2020-10-21 MED ORDER — OXYCODONE-ACETAMINOPHEN 5-325 MG PO TABS
1.0000 | ORAL_TABLET | Freq: Once | ORAL | Status: AC
Start: 2020-10-21 — End: 2020-10-21
  Administered 2020-10-21: 1 via ORAL
  Filled 2020-10-21: qty 1

## 2020-10-21 NOTE — ED Triage Notes (Addendum)
Patient BIB EMS after slipping and falling onto his back in a store today. No LOC, patient ambulated to EMS stretcher in store. Patient alert, oriented, and in no apparent distress at this time.

## 2020-10-21 NOTE — ED Notes (Signed)
Patient verbalizes understanding of discharge instructions. Opportunity for questioning and answers were provided. Armband removed by staff, pt discharged from ED.  

## 2020-10-21 NOTE — Discharge Instructions (Addendum)
You were seen in the ER for evaluation after a fall  You reported posterior head pain, neck and back pain, right shoulder pain  CT and x-ray did not show any acute or new bone injuries after fall today. There was some age related degenerative changes to your neck and low back   Your pain is likely from bruising or muscular strains. This typically worsens 24-72 hours after the accident but resolved or improves after 7-10 days  For pain and inflammation you can use a combination of ibuprofen and acetaminophen.  Take [phone] mg acetaminophen (tylenol) every 6 hours or 600 mg ibuprofen (advil, motrin) every 6 hours.  You can take these separately or combine them every 6 hours for maximum pain control. Do not exceed 4,000 mg acetaminophen or 2,400 mg ibuprofen in a 24 hour period.  Do not take ibuprofen containing products if you have history of kidney disease, ulcers, GI bleeding, severe acid reflux, or take a blood thinner.  Do not take acetaminophen if you have liver disease.   I have prescribed cyclobenzaprine 10 mg at night - this can help with muscle spasm and tightness  Heat, massage and strength of neck and back can also help release muscular pain and tension  Follow up with primary care doctor in 7-10 days for re-check if symptoms not improving   Return for severe persistent headache, vision changes, stroke symptoms, weakness or numbness on extremities

## 2020-10-21 NOTE — ED Provider Notes (Signed)
[facility] EMERGENCY DEPARTMENT Provider Note   CSN: [id number] Arrival date & time: 10/21/20  1537     History Chief Complaint  Patient presents with  . Fall    Tristan Parker is a 60 y.o. male presents to the ED by EMS for evaluation of fall that occurred at [place] approximately 2 hours prior to arrival.  Patient states he was walking in the store and slipped over "some grease" on the floor.  He slipped and fell backwards.  He struck the back of his head, upper back, lower back and right shoulder.  Reports localized pain in the back over his head and diffuse back pain that radiates to his neck and right shoulder blade.  Pain is constant, moderate, worse with movement and palpation.  Felt a little lightheaded when he fell but this has resolved.  No loss of consciousness, visual changes, nausea, vomiting.  No chest pain or shortness of breath.  No abdominal pain.  Has walked since.  No oral anticoagulants.  Denies previous history of significant injuries or surgeries to his neck or back.  Interventions.  HPI     Past Medical History:  Diagnosis Date  . GERD (gastroesophageal reflux disease)   . Hyperlipemia   . Hypertension     Patient Active Problem List   Diagnosis Date Noted  . Tobacco abuse 06/15/2017  . Type 2 diabetes mellitus with complication, without long-term current use of insulin ([place]) 06/14/2017  . Prediabetes 06/13/2017  . Pure hyperglyceridemia 06/13/2017  . Elevated LFTs 07/06/2016  . Hyperlipidemia with target low density lipoprotein (LDL) cholesterol less than 130 mg/dL 11/09/2012  . Essential hypertension, benign 10/25/2012  . Routine general medical examination at a health care facility 10/25/2012  . Erectile dysfunction 10/25/2012  . Nonspecific abnormal electrocardiogram (ECG) (EKG) 10/25/2012    Past Surgical History:  Procedure Laterality Date  . FOREIGN BODY REMOVAL     as a child from leg  . None         Family  History  Problem Relation Age of Onset  . Stroke Mother   . Stroke Father   . Hypertension Father   . Cancer Neg Hx   . Alcohol abuse Neg Hx   . Asthma Neg Hx   . COPD Neg Hx   . Depression Neg Hx   . Diabetes Neg Hx   . Drug abuse Neg Hx   . Early death Neg Hx   . Hearing loss Neg Hx   . Heart disease Neg Hx   . Hyperlipidemia Neg Hx   . Kidney disease Neg Hx     Social History   Tobacco Use  . Smoking status: Former Smoker    Packs/day: 0.25    Years: 25.00    Pack years: 6.25    Types: Cigarettes    Quit date: 07/29/2016    Years since quitting: 4.2  . Smokeless tobacco: Never Used  Vaping Use  . Vaping Use: Never used  Substance Use Topics  . Alcohol use: Yes    Alcohol/week: 14.0 standard drinks    Types: 14 Cans of beer per week  . Drug use: No    Home Medications Prior to Admission medications   Medication Sig Start Date End Date Taking? Authorizing Provider  cyclobenzaprine (FLEXERIL) 10 MG tablet Take 1 tablet (10 mg total) by mouth at bedtime for 15 days. 10/21/20 11/05/20 Yes Carmon Sails J, PA-C  famotidine (PEPCID) 20 MG tablet Take 1 tablet (  20 mg total) by mouth 2 (two) times daily. 07/30/19   Jaynee Eagles, PA-C  olmesartan (BENICAR) 40 MG tablet Take 40 mg by mouth daily.    [provider]  rosuvastatin (CRESTOR) 20 MG tablet Take 1 tablet (20 mg total) by mouth daily. 06/13/17   Janith Lima, MD  amLODipine (NORVASC) 5 MG tablet Take 1 tablet (5 mg total) by mouth daily. 06/13/17 07/30/19  Janith Lima, MD  lisinopril-hydrochlorothiazide (PRINZIDE,ZESTORETIC) 20-25 MG tablet Take 1 tablet by mouth daily. 06/13/17 07/30/19  Janith Lima, MD    Allergies    Patient has no known allergies.  Review of Systems   Review of Systems  Musculoskeletal: Positive for arthralgias (right shoulder) and back pain.  Neurological: Positive for headaches (posterior head pain).  All other systems reviewed and are negative.   Physical  Exam Updated Vital Signs BP (!) 126/98 (BP Location: Left Arm)   Pulse 91   Temp 99.3 F (37.4 C) (Oral)   Resp 17   SpO2 96%   Physical Exam Vitals and nursing note reviewed.  Constitutional:      General: He is not in acute distress.    Appearance: He is well-developed and well-nourished.     Comments: Sitting up in bed  HENT:     Head: Normocephalic.     Comments: Mild posterior/occipital scalp tenderness.  Skin and hair normal.  No obvious swelling, crepitus or skin injury/lacerations    Right Ear: External ear normal.     Left Ear: External ear normal.     Nose: Nose normal.  Eyes:     General: No scleral icterus.    Extraocular Movements: EOM normal.     Conjunctiva/sclera: Conjunctivae normal.  Neck:     Comments: Diffuse midline and paraspinal muscular tenderness even with very light grease of the skin.  Full range of motion of the neck  Cardiovascular:     Rate and Rhythm: Normal rate and regular rhythm.     Pulses: Intact distal pulses.     Heart sounds: Normal heart sounds. No murmur heard.   Pulmonary:     Effort: Pulmonary effort is normal.     Breath sounds: Normal breath sounds. No wheezing.  Musculoskeletal:        General: Tenderness present. No deformity. Normal range of motion.     Cervical back: Normal range of motion and neck supple. Tenderness present.     Comments: Patient wincing in pain and moving away from me during palpation of the back.  Reports pain with minimal grazing/light touch of midline and paraspinal thoracic and lumbar spine.  Skin is normal over the back without contusions or skin injury.  Diffuse right scapular, posterior shoulder tenderness.  Skin normal.  Patient does not want to move his right shoulder.  Passive range of motion done without any crepitus.  Upper and lower compartments of the right arm soft and nontender.  No tenderness of the right elbow, wrist full range of motion of these joints.  Skin:    General: Skin is warm and  dry.     Capillary Refill: Capillary refill takes less than 2 seconds.  Neurological:     Mental Status: He is alert and oriented to person, place, and time.     Comments: Sensation and strength intact in upper and lower extremities  Psychiatric:        Mood and Affect: Mood and affect normal.        Behavior: Behavior  normal.        Thought Content: Thought content normal.        Judgment: Judgment normal.     ED Results / Procedures / Treatments   Labs (all labs ordered are listed, but only abnormal results are displayed) Labs Reviewed - No data to display  EKG None  Radiology DG Thoracic Spine 2 View  Result Date: 10/21/2020 CLINICAL DATA:  [place], hit back EXAM: THORACIC SPINE 2 VIEWS COMPARISON:  01/01/2017 FINDINGS: Frontal and lateral views of the thoracic spine demonstrate anatomic alignment. No acute fractures. Mild spondylosis at the thoracolumbar junction on appreciably changed. Paraspinal soft tissues are normal. IMPRESSION: 1. Stable lower thoracic spondylosis.  No acute fracture. Electronically Signed   By: Randa Ngo M.D.   On: 10/21/2020 17:49   DG Lumbar Spine Complete  Result Date: 10/21/2020 CLINICAL DATA:  [place], hit back EXAM: LUMBAR SPINE - COMPLETE 4+ VIEW COMPARISON:  01/01/2017 FINDINGS: Frontal, bilateral oblique, lateral views of the lumbar spine are obtained. 5 non-rib-bearing lumbar type vertebral bodies are again identified, with stable left convex curvature centered at L3. No acute displaced fractures. Stable spondylosis at L3-4, with mild progressive spondylosis at L4-5. Stable facet hypertrophic changes at the lumbosacral junction. Sacroiliac joints are normal. IMPRESSION: 1. Multilevel spondylosis and facet hypertrophy.  No acute fracture. Electronically Signed   By: Randa Ngo M.D.   On: 10/21/2020 17:51   DG Shoulder Right  Result Date: 10/21/2020 CLINICAL DATA:  [place], injured shoulder EXAM: RIGHT SHOULDER - 2+ VIEW COMPARISON:  None. FINDINGS:  Internal rotation, external rotation, transscapular views of the right shoulder are obtained. No acute displaced fracture, subluxation, or dislocation. Incidental os acromiale. 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 Right chest is clear. IMPRESSION: 1. Moderate osteoarthritis.  No acute fracture. Electronically Signed   By: Randa Ngo M.D.   On: 10/21/2020 17:50   CT Head Wo Contrast  Result Date: 10/21/2020 CLINICAL DATA:  Fall.  Hit back of head. EXAM: CT HEAD WITHOUT CONTRAST CT CERVICAL SPINE WITHOUT CONTRAST TECHNIQUE: Multidetector CT imaging of the head and cervical spine was performed following the standard protocol without intravenous contrast. Multiplanar CT image reconstructions of the cervical spine were also generated. COMPARISON:  Jan 01, 2017. FINDINGS: CT HEAD FINDINGS Brain: No evidence of acute infarction, hemorrhage, hydrocephalus, extra-axial collection or mass lesion/mass effect. Similar punctate calcification in the left frontal cortex. Vascular: Calcific atherosclerosis. No hyperdense vessel identified. Skull: No acute fracture. Sinuses/Orbits: Scattered paranasal sinus mucosal thickening without air-fluid levels. Other: No mastoid effusions. CT CERVICAL SPINE FINDINGS Alignment: No substantial sagittal subluxation. Mild broad dextrocurvature, likely positional. Skull base and vertebrae: Vertebral body heights are maintained. No evidence of acute fracture. Soft tissues and spinal canal: No prevertebral fluid or swelling. No visible canal hematoma. Disc levels: Intervertebral disc spaces are largely maintained. Multilevel anterior osteophytes. Upper chest: Visualized lung apices are clear. Other: Calcific atherosclerosis of the carotid arteries. IMPRESSION: 1. No evidence of acute intracranial abnormality. 2. No evidence of acute fracture or traumatic malalignment. 3. Paranasal sinus mucosal thickening. Electronically Signed   By: Margaretha Sheffield MD    On: 10/21/2020 17:43   CT Cervical Spine Wo Contrast  Result Date: 10/21/2020 CLINICAL DATA:  Fall.  Hit back of head. EXAM: CT HEAD WITHOUT CONTRAST CT CERVICAL SPINE WITHOUT CONTRAST TECHNIQUE: Multidetector CT imaging of the head and cervical spine was performed following the standard protocol without intravenous contrast. Multiplanar CT image reconstructions of the cervical spine were also  generated. COMPARISON:  Jan 01, 2017. FINDINGS: CT HEAD FINDINGS Brain: No evidence of acute infarction, hemorrhage, hydrocephalus, extra-axial collection or mass lesion/mass effect. Similar punctate calcification in the left frontal cortex. Vascular: Calcific atherosclerosis. No hyperdense vessel identified. Skull: No acute fracture. Sinuses/Orbits: Scattered paranasal sinus mucosal thickening without air-fluid levels. Other: No mastoid effusions. CT CERVICAL SPINE FINDINGS Alignment: No substantial sagittal subluxation. Mild broad dextrocurvature, likely positional. Skull base and vertebrae: Vertebral body heights are maintained. No evidence of acute fracture. Soft tissues and spinal canal: No prevertebral fluid or swelling. No visible canal hematoma. Disc levels: Intervertebral disc spaces are largely maintained. Multilevel anterior osteophytes. Upper chest: Visualized lung apices are clear. Other: Calcific atherosclerosis of the carotid arteries. IMPRESSION: 1. No evidence of acute intracranial abnormality. 2. No evidence of acute fracture or traumatic malalignment. 3. Paranasal sinus mucosal thickening. Electronically Signed   By: Margaretha Sheffield MD   On: 10/21/2020 17:43    Procedures Procedures   Medications Ordered in ED Medications  oxyCODONE-acetaminophen (PERCOCET/ROXICET) 5-325 MG per tablet 1 tablet (1 tablet Oral Given 10/21/20 1747)  cyclobenzaprine (FLEXERIL) tablet 10 mg (10 mg Oral Given 10/21/20 1747)    ED Course  I have reviewed the triage vital signs and the nursing notes.  Pertinent  labs & imaging results that were available during my care of the patient were reviewed by me and considered in my medical decision making (see chart for details).    MDM Rules/Calculators/A&P                           60 y.o. year old male presents with chief complaint of mechanical fall.  Reports posterior head, neck, diffuse back and right shoulder pain.  Pain is at a proportion to exam.  EMR, triage and nursing notes reviewed  Imaging ordered by me  Personally visualized and interpreted above labs and imaging   Imaging reveals -no acute or traumatic injuries noted, DDD noted  Medicines ordered-Flexeril, Percocet  Patient reevaluated.  Updated on results of imaging.  Reasonable to discharge with symptomatic treatment of contusions versus muscular strains.  Follow-up with PCP in 7 to 10 days for persistent pain.  Patient in agreement with ER treatment and discharge plan.   Final Clinical Impression(s) / ED Diagnoses Final diagnoses:  Fall, initial encounter  Back pain of thoracolumbar region  Neck pain  Acute pain of right shoulder    Rx / DC Orders ED Discharge Orders         Ordered    cyclobenzaprine (FLEXERIL) 10 MG tablet  Daily at bedtime        10/21/20 1723           Kinnie Feil, PA-C 10/21/20 1815    Drenda Freeze, MD 10/21/20 2101

## 2021-03-04 ENCOUNTER — Emergency Department (HOSPITAL_COMMUNITY)
Admission: EM | Admit: 2021-03-04 | Discharge: 2021-03-05 | Disposition: A | Discharge status: Home / Self Care | Payer: BC Managed Care – PPO | Attending: Emergency Medicine | Admitting: Emergency Medicine

## 2021-03-04 ENCOUNTER — Emergency Department (HOSPITAL_COMMUNITY): Payer: BC Managed Care – PPO

## 2021-03-04 DIAGNOSIS — F10129 Alcohol abuse with intoxication, unspecified: Secondary | ICD-10-CM | POA: Diagnosis not present

## 2021-03-04 DIAGNOSIS — I10 Essential (primary) hypertension: Secondary | ICD-10-CM | POA: Insufficient documentation

## 2021-03-04 DIAGNOSIS — Z87891 Personal history of nicotine dependence: Secondary | ICD-10-CM | POA: Diagnosis not present

## 2021-03-04 DIAGNOSIS — W1830XA Fall on same level, unspecified, initial encounter: Secondary | ICD-10-CM | POA: Diagnosis not present

## 2021-03-04 DIAGNOSIS — E119 Type 2 diabetes mellitus without complications: Secondary | ICD-10-CM | POA: Diagnosis not present

## 2021-03-04 DIAGNOSIS — R270 Ataxia, unspecified: Secondary | ICD-10-CM | POA: Insufficient documentation

## 2021-03-04 DIAGNOSIS — Y92090 Kitchen in other non-institutional residence as the place of occurrence of the external cause: Secondary | ICD-10-CM | POA: Insufficient documentation

## 2021-03-04 DIAGNOSIS — W19XXXA Unspecified fall, initial encounter: Secondary | ICD-10-CM

## 2021-03-04 DIAGNOSIS — F10920 Alcohol use, unspecified with intoxication, uncomplicated: Secondary | ICD-10-CM

## 2021-03-04 DIAGNOSIS — Z79899 Other long term (current) drug therapy: Secondary | ICD-10-CM | POA: Diagnosis not present

## 2021-03-04 DIAGNOSIS — Y908 Blood alcohol level of 240 mg/100 ml or more: Secondary | ICD-10-CM | POA: Diagnosis not present

## 2021-03-04 LAB — COMPREHENSIVE METABOLIC PANEL
ALT: 40 U/L (ref 0–44)
AST: 49 U/L — ABNORMAL HIGH (ref 15–41)
Albumin: 3.7 g/dL (ref 3.5–5.0)
Alkaline Phosphatase: 43 U/L (ref 38–126)
Anion gap: 9 (ref 5–15)
BUN: 5 mg/dL — ABNORMAL LOW (ref 6–20)
CO2: 24 mmol/L (ref 22–32)
Calcium: 8.3 mg/dL — ABNORMAL LOW (ref 8.9–10.3)
Chloride: 105 mmol/L (ref 98–111)
Creatinine, Ser: 0.74 mg/dL (ref 0.61–1.24)
GFR, Estimated: >60 mL/min (ref >60)
Glucose, Bld: 91 mg/dL (ref 70–99)
Potassium: 3.3 mmol/L — ABNORMAL LOW (ref 3.5–5.1)
Sodium: 138 mmol/L (ref 135–145)
Total Bilirubin: 0.7 mg/dL (ref 0.3–1.2)
Total Protein: 6.3 g/dL — ABNORMAL LOW (ref 6.5–8.1)

## 2021-03-04 LAB — CBC WITH DIFFERENTIAL/PLATELET
Abs Immature Granulocytes: 0.02 10*3/uL (ref 0.00–0.07)
Basophils Absolute: 0.1 10*3/uL (ref 0.0–0.1)
Basophils Relative: 1 %
Eosinophils Absolute: 0.1 10*3/uL (ref 0.0–0.5)
Eosinophils Relative: 1 %
HCT: 47.3 % (ref 39.0–52.0)
Hemoglobin: 16.3 g/dL (ref 13.0–17.0)
Immature Granulocytes: 0 %
Lymphocytes Relative: 39 %
Lymphs Abs: 2.6 10*3/uL (ref 0.7–4.0)
MCH: 32.5 pg (ref 26.0–34.0)
MCHC: 34.5 g/dL (ref 30.0–36.0)
MCV: 94.4 fL (ref 80.0–100.0)
Monocytes Absolute: 0.5 10*3/uL (ref 0.1–1.0)
Monocytes Relative: 8 %
Neutro Abs: 3.3 10*3/uL (ref 1.7–7.7)
Neutrophils Relative %: 51 %
Platelets: 154 10*3/uL (ref 150–400)
RBC: 5.01 MIL/uL (ref 4.22–5.81)
RDW: 13.5 % (ref 11.5–15.5)
WBC: 6.5 10*3/uL (ref 4.0–10.5)
nRBC: 0 % (ref 0.0–0.2)

## 2021-03-04 LAB — ETHANOL: Alcohol, Ethyl (B): 404 mg/dL (ref <10)

## 2021-03-04 NOTE — ED Triage Notes (Addendum)
Pt BIB [facility] after family called for a fall with after finding the pt on the floor in his kitchen and had episode of incontinence. Hx of ETOH intake, pt was not oriented with EMS initially and would not respond appropriately to questions. Unknown LOC. C-collar in place at this time d/t unknown mechanism of injury. Pt A&Ox1 with EMS.

## 2021-03-04 NOTE — ED Notes (Signed)
Pt observed dressed in his clothes at the door of the room asking how to get to the waiting room to see his family. This RN and NT Cecille Aver assisted pt back to bed and reapplied monitoring devices. Pt complied with assistance and has been reminded to use call bell when needing assistance and that his family will be updated on his status.

## 2021-03-04 NOTE — ED Provider Notes (Signed)
[facility] EMERGENCY DEPARTMENT Provider Note   CSN: [id number] Arrival date & time: 03/04/21  2100     History Chief Complaint  Patient presents with   Alcohol Intoxication    Treyshaun Keatts Gentry Roch is a 60 y.o. male.   Alcohol Intoxication   This patient is a 60 year old male, according to the medical record he has a history of type 2 diabetes, he is a smoker, he has hyperlipidemia, hypertension and is able to tell me that he takes lisinopril.  The patient presents to the hospital with altered mental status after having a fall being found on the kitchen floor.  There was some incontinence, he has been drinking heavily this evening.  The patient was not oriented with EMS and was not responding very well, they placed a c-collar.  Level 5 caveat applies secondary to altered mental status.  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/13/2017   Elevated LFTs 07/06/2016   Hyperlipidemia with target low density lipoprotein (LDL) cholesterol less than 130 mg/dL 11/09/2012   Essential hypertension, benign 10/25/2012   Routine general medical examination at a health care facility 10/25/2012   Erectile dysfunction 10/25/2012   Nonspecific abnormal electrocardiogram (ECG) (EKG) 10/25/2012    Past Surgical History:  Procedure Laterality Date   FOREIGN BODY REMOVAL     as a child from leg   None         Family History  Problem Relation Age of Onset   Stroke Mother    Stroke Father    Hypertension Father    Cancer Neg Hx    Alcohol abuse Neg Hx    Asthma Neg Hx    COPD Neg Hx    Depression Neg Hx    Diabetes Neg Hx    Drug abuse Neg Hx    Early death Neg  Hx    Hearing loss Neg Hx    Heart disease Neg Hx    Hyperlipidemia Neg Hx    Kidney disease Neg Hx     Social History   Tobacco Use   Smoking status: Former    Packs/day: 0.25    Years: 25.00    Pack years: 6.25    Types: Cigarettes    Quit date: 07/29/2016    Years since quitting: 4.6   Smokeless tobacco: Never  Vaping Use   Vaping Use: Never used  Substance Use Topics   Alcohol use: Yes    Alcohol/week: 14.0 standard drinks    Types: 14 Cans of beer per week   Drug use: No    Home Medications Prior to Admission medications   Medication Sig Start Date End Date Taking? Authorizing Provider  famotidine (PEPCID) 20 MG tablet Take 1 tablet (20 mg total) by mouth 2 (two) times daily. 07/30/19   Jaynee Eagles, PA-C  olmesartan (BENICAR) 40 MG tablet Take 40 mg by mouth daily.    [provider]  rosuvastatin (CRESTOR) 20 MG tablet Take 1 tablet (20 mg total) by mouth daily. 06/13/17   Janith Lima, MD  amLODipine (NORVASC) 5 MG tablet Take 1 tablet (5 mg total) by mouth daily. 06/13/17 07/30/19  Janith Lima, MD  lisinopril-hydrochlorothiazide (PRINZIDE,ZESTORETIC) 20-25 MG tablet Take 1 tablet by mouth daily. 06/13/17 07/30/19  Janith Lima, MD    Allergies    Patient has no known allergies.  Review of Systems   Review of Systems  Unable to perform ROS: Mental status change   Physical Exam Updated Vital Signs BP (!) 145/107   Pulse 92   Temp 97.8 F (36.6 C) (Oral)   Resp (!) 27   SpO2 95%   Physical Exam Vitals and nursing note reviewed.  Constitutional:      General: He is not in acute distress.    Appearance: He is well-developed.  HENT:     Head: Normocephalic and atraumatic.     Mouth/Throat:     Pharynx: No oropharyngeal exudate.  Eyes:     General: No scleral icterus.       Right eye: No discharge.        Left eye: No discharge.     Conjunctiva/sclera: Conjunctivae normal.     Pupils: Pupils are equal, round, and reactive to light.   Neck:     Thyroid: No thyromegaly.     Vascular: No JVD.  Cardiovascular:     Rate and Rhythm: Normal rate and regular rhythm.     Heart sounds: Normal heart sounds. No murmur heard.   No friction rub. No gallop.  Pulmonary:     Effort: Pulmonary effort is normal. No respiratory distress.     Breath sounds: Normal breath sounds. No wheezing or rales.  Abdominal:     General: Bowel sounds are normal. There is no distension.     Palpations: Abdomen is soft. There is no mass.     Tenderness: There is no abdominal tenderness.  Musculoskeletal:        General: No tenderness. Normal range of motion.     Cervical back: Normal range of motion and neck supple.     Comments: No tenderness over the cervical spine no tenderness over the chest wall, no tenderness over the extremities x4  Lymphadenopathy:     Cervical: No cervical adenopathy.  Skin:    General: Skin is warm and dry.     Findings: No erythema or rash.  Neurological:     Mental Status: He is alert.     Coordination: Coordination normal.     Comments: Slurred speech, confusion, no obvious facial droop, moves all 4 extremities, seems to be moving slowly and speaking slowly  Psychiatric:        Behavior: Behavior normal.    ED Results / Procedures / Treatments   Labs (all labs ordered are listed, but only abnormal results are displayed) Labs Reviewed  COMPREHENSIVE METABOLIC PANEL - Abnormal; Notable for the following components:      Result Value   Potassium 3.3 (*)    BUN 5 (*)    Calcium 8.3 (*)    Total Protein 6.3 (*)    AST 49 (*)    All other components within normal limits  ETHANOL - Abnormal; Notable for the following components:   Alcohol, Ethyl (B) 404 (*)    All other components within normal limits  CBC WITH DIFFERENTIAL/PLATELET    EKG None  Radiology CT Head Wo Contrast  Result Date: 03/04/2021 CLINICAL DATA:  Ataxia.  Head and neck trauma. EXAM: CT HEAD WITHOUT CONTRAST CT CERVICAL SPINE WITHOUT  CONTRAST TECHNIQUE: Multidetector CT imaging of the head and cervical spine was performed following the standard protocol without intravenous contrast. Multiplanar CT image  reconstructions of the cervical spine were also generated. COMPARISON:  10/21/2020 FINDINGS: CT HEAD FINDINGS Brain: No evidence of acute infarction, hemorrhage, hydrocephalus, extra-axial collection or mass lesion/mass effect. Diffuse cerebral atrophy. Patchy low-attenuation changes in the deep white matter consistent with small vessel ischemia. Vascular: No hyperdense vessel or unexpected calcification. Skull: Normal. Negative for fracture or focal lesion. Sinuses/Orbits: Mucosal thickening in the paranasal sinuses. No acute air-fluid levels. Mastoid air cells are clear. Other: None. CT CERVICAL SPINE FINDINGS Alignment: Normal. Skull base and vertebrae: No acute fracture. No primary bone lesion or focal pathologic process. Soft tissues and spinal canal: No prevertebral fluid or swelling. No visible canal hematoma. Disc levels: Degenerative changes with disc space narrowing and endplate osteophyte formation most prominent at C3-4, C4-5, C5-6, and C6-7 levels. Degenerative changes in the facet joints. Upper chest: Lung apices are clear. Other: None. IMPRESSION: 1. No acute intracranial abnormalities. Mild chronic atrophy and small vessel ischemic changes. 2. Normal alignment of the cervical spine. No acute displaced fractures identified. Moderate degenerative changes. Electronically Signed   By: Lucienne Capers M.D.   On: 03/04/2021 22:21   CT Cervical Spine Wo Contrast  Result Date: 03/04/2021 CLINICAL DATA:  Ataxia.  Head and neck trauma. EXAM: CT HEAD WITHOUT CONTRAST CT CERVICAL SPINE WITHOUT CONTRAST TECHNIQUE: Multidetector CT imaging of the head and cervical spine was performed following the standard protocol without intravenous contrast. Multiplanar CT image reconstructions of the cervical spine were also generated. COMPARISON:   10/21/2020 FINDINGS: CT HEAD FINDINGS Brain: No evidence of acute infarction, hemorrhage, hydrocephalus, extra-axial collection or mass lesion/mass effect. Diffuse cerebral atrophy. Patchy low-attenuation changes in the deep white matter consistent with small vessel ischemia. Vascular: No hyperdense vessel or unexpected calcification. Skull: Normal. Negative for fracture or focal lesion. Sinuses/Orbits: Mucosal thickening in the paranasal sinuses. No acute air-fluid levels. Mastoid air cells are clear. Other: None. CT CERVICAL SPINE FINDINGS Alignment: Normal. Skull base and vertebrae: No acute fracture. No primary bone lesion or focal pathologic process. Soft tissues and spinal canal: No prevertebral fluid or swelling. No visible canal hematoma. Disc levels: Degenerative changes with disc space narrowing and endplate osteophyte formation most prominent at C3-4, C4-5, C5-6, and C6-7 levels. Degenerative changes in the facet joints. Upper chest: Lung apices are clear. Other: None. IMPRESSION: 1. No acute intracranial abnormalities. Mild chronic atrophy and small vessel ischemic changes. 2. Normal alignment of the cervical spine. No acute displaced fractures identified. Moderate degenerative changes. Electronically Signed   By: Lucienne Capers M.D.   On: 03/04/2021 22:21    Procedures Procedures   Medications Ordered in ED Medications - No data to display  ED Course  I have reviewed the triage vital signs and the nursing notes.  Pertinent labs & imaging results that were available during my care of the patient were reviewed by me and considered in my medical decision making (see chart for details).  Clinical Course as of 03/04/21 2310  Thu Mar 04, 2021  2309 And change of shift, care signed out to Dr. Darl Householder to follow-up patient's improvement, his alcohol is unfortunately over 400, he is still slurring his words but does not appear to have any signs of trauma clinically or on radiographic's.  At this time  the patient is hemodynamically stable but from a mental status standpoint is not sober enough to be discharged. [BM]    Clinical Course User Index [BM] Noemi Chapel, MD   MDM Rules/Calculators/A&P  The patient is not able to tell me exactly what happened but he is more awake and alert though he is very slow to respond and has a bit of a slurred speech.  He was found on the floor of his kitchen and has been drinking alcohol, he cannot tell me the name of the alcohol he was drinking.  He is not tachycardic has no focal neurologic deficits other than those above which I think is more encephalopathic from drug or substance abuse.  Labs and imaging of been ordered to clear his cervical spine,  Final Clinical Impression(s) / ED Diagnoses Final diagnoses:  Alcoholic intoxication without complication ([place])  Fall, initial encounter    Rx / DC Orders ED Discharge Orders     None        Noemi Chapel, MD 03/04/21 2310

## 2021-03-04 NOTE — ED Notes (Signed)
Dr Sabra Heck made aware of critical ETOH result

## 2021-03-05 NOTE — ED Provider Notes (Signed)
  Physical Exam  BP (!) 156/114   Pulse 89   Temp 97.8 F (36.6 C) (Oral)   Resp (!) 23   SpO2 94%   Physical Exam  ED Course/Procedures   Clinical Course as of 03/05/21 0608  Thu Mar 04, 2021  2309 And change of shift, care signed out to Dr. Darl Householder to follow-up patient's improvement, his alcohol is unfortunately over 400, he is still slurring his words but does not appear to have any signs of trauma clinically or on radiographic's.  At this time the patient is hemodynamically stable but from a mental status standpoint is not sober enough to be discharged. [BM]    Clinical Course User Index [BM] Noemi Chapel, MD    Procedures  MDM  Patient care assumed at 11 PM.  Patient was intoxicated and was noted to be altered.  He has alcohol level of 400.  His CT head was unremarkable.  Signout pending reassessment when he is sober.   6 am  Patient is clinically sober and is back to baseline.  Nursing called his wife to pick him up.  Told him to stop drinking alcohol.     Drenda Freeze, MD 03/05/21 [phone]

## 2021-03-05 NOTE — ED Notes (Signed)
This RN attempted to call wife x3 with no answer. Voicemail left stating pt is up for discharge from facility and needs ride home. Number provided for callback.

## 2021-03-05 NOTE — Discharge Instructions (Addendum)
Your alcohol level is high and that is why you are confused.  Your CT scan did not show any fractures or bleeding  Please stop drinking alcohol  See your doctor for follow-up  Return to ER if you have another fall, alcohol intoxication or withdrawal

## 2022-04-29 DIAGNOSIS — D122 Benign neoplasm of ascending colon: Secondary | ICD-10-CM

## 2024-08-08 NOTE — ED Provider Notes (Signed)
 " [facility]  ED Provider Note  [facility] 63 y.o. male DOB: 07/15/1961 MRN: [id number] History   Chief Complaint  Patient presents with   Fall    Patient from work, states that he slipped on oil and hit his head on concrete.  He is CAOx4, no LOC, full recall GCS of 15.  Patient complaining of neck, left shoulder, lower back and head pain.  No blood thinners.   63 year old male with prior medical history as detailed below presents for evaluation.  Patient reports that he was at work.  He slipped on oil and fell hard.  He did strike his head against the floor.  He complains of head injury, neck pain, left shoulder pain, low back pain.  He is ambulatory after the fall.  He denies LOC.  He denies bleeding.   History provided by:  Patient and medical records      Past Medical History:  Diagnosis Date   Hyperlipidemia    Hypertension     Past Surgical History:  Procedure Laterality Date   Hand surgery Right    Knee surgery     Tonsillectomy      Social History   Substance and Sexual Activity  Alcohol Use Not Currently   Tobacco Use History[1] E-Cigarettes   Vaping Use Never User    Start Date     Cartridges/Day     Quit Date     Social History   Substance and Sexual Activity  Drug Use No   Tetanus up to date?: Yes     Allergies[2]  Home Medications   NA SULFATE-POTASSIUM SULFATE-MAGNESIUM SULFATE (SUPREP BOWEL PREP) 17.5-3.13-1.6 GM/177ML    Take 177 mLs by mouth 2 (two) times. Please refer to prep instructions sent by [facility]   OLMESARTAN-HYDROCHLOROTHIAZIDE  (BENICAR HCT) 40-25 MG PER TABLET    TAKE ONE TABLET BY MOUTH ONCE A DAY   ROSUVASTATIN  CALCIUM  (CRESTOR ) 40 MG TABLET    TAKE ONE TABLET BY MOUTH ONCE A DAY    Primary Survey   Exposure    No visible abdominal trauma.  No visible trauma noted on back exam.      Review of Systems   Review of Systems  All other systems reviewed and are  negative.   Physical Exam   ED Triage Vitals [08/08/24 0235]  BP 133/86  Heart Rate 71  Resp 17  SpO2 96 %  Temp 98.3 F (36.8 C)    Physical Exam  Nursing note and vitals reviewed. Constitutional: He appears well-developed and well-nourished.  HENT:  Head: Normocephalic and atraumatic.  Eyes: EOM are intact. Pupils are equal, round, and reactive to light.  Neck:  Cervical collar placed in triage  Cardiovascular: Normal rate and regular rhythm.  Pulmonary/Chest: Respiratory effort normal and breath sounds normal.  Abdominal: Soft. Bowel sounds are normal. No visible abdominal trauma.  Musculoskeletal: Normal range of motion. No visible trauma noted on back exam.   Neurological: He is alert and oriented to person, place, and time.  Skin: Skin is warm. Skin is dry.     ED Course   Lab results: No data to display  Imaging:   CT HEAD WO CONTRAST   Narrative:    CT HEAD WITHOUT CONTRAST   TECHNIQUE: Noncontrast axial images of the head obtained. Multiplanar reconstructions were obtained and reviewed .Dose reduction was utilized (automated exposure control, mA or kV adjustment based on patient size, or iterative image reconstruction).  INDICATION: Trauma Head  COMPARISON: None   FINDINGS:  There is no intracranial hemorrhage, edema, mass effect of midline shift.  There is no abnormal extra-axial fluid collection.  There is no evidence for acute infarct. .  Ventricular size is appropriate for brain volume. No acute calvarial abnormality.  The paranasal sinuses and mastoid air cells demonstrate no acute abnormality.     Impression:    IMPRESSION:  No acute intracranial abnormality.    Electronically Signed by: Sonny Livings, MD on 08/08/2024 4:04 AM  CT SPINE CERVICAL WO CONTRAST   Narrative:    CT CERVICAL SPINE  INDICATION: fall  TECHNIQUE: CT imaging of the cervical spine performed.  Axial images and multiplanar reconstructions were obtained and  reviewed. Dose reduction was utilized (automated exposure control, mA or kV adjustment based on patient size, or iterative image reconstruction).  COMPARISON: None   FINDINGS:  There is no cervical spine fracture or post-traumatic malalignment. Vertebral heights and alignment are maintained. There are multilevel mild degenerative changes Prevertebral soft tissues and paravertebral musculature are unremarkable.     Impression:    IMPRESSION:  1.  No acute abnormality.   Electronically Signed by: Sonny Livings, MD on 08/08/2024 4:06 AM  XR CHEST PA AND LATERAL   Narrative:    EXAM: XR CHEST PA AND LATERAL  INDICATION: Trauma  COMPARISON: none  FINDINGS:  There is no cardiomegaly or pulmonary vascular congestion. There is no acute infiltrate. Lungs are clear. There is no pleural effusion or pneumothorax seen.     Impression:    IMPRESSION:  No acute abnormality.   Electronically Signed by: Sonny Livings, MD on 08/08/2024 4:15 AM  XR SHOULDER 2+ VIEWS LEFT   Narrative:    EXAM: XR SHOULDER 2+ VIEWS LEFT  INDICATION: Shoulder-upper arm injury  COMPARISON: none  FINDINGS:  There is no acute fracture.  There is no dislocation seen. There is moderately severe glenohumeral degenerative change. Tiny calcification adjacent to the greater tuberosity may reflect calcific tendinitis.    Impression:    IMPRESSION: No acute abnormality. Degenerative findings. Calcification adjacent to the greater tuberosity may represent minimal calcific tendinitis.   Electronically Signed by: Sonny Livings, MD on 08/08/2024 4:15 AM  XR SPINE LUMBAR 2-3 VIEWS   Narrative:    EXAM: XR SPINE LUMBAR 2-3 VIEWS  INDICATION: Fall  COMPARISON: none  FINDINGS:  No fracture identified. There is mild levoconvex scoliosis is excluded There are degenerative findings throughout the visualized spine. There is mild intervertebral narrowing at L1-2, L2-3. There is moderate narrowing at L3-4.  There are endplate spurs throughout the lumbar spine. There is moderate mid to lower lumbar facet arthropathy    Impression:    IMPRESSION: Multilevel spondylosis, most notable L3-4. There is no acute abnormality identified.    Electronically Signed by: Sonny Livings, MD on 08/08/2024 4:13 AM     ECG: ECG Results   None                                                                        Pre-Sedation Procedures    Medical Decision Making Patient presents after fall from standing.  He did strike his head.  LOC was not reported.  GCS is 15.  Patient is neurologically intact.  Obtained imaging is without evidence of significant acute traumatic injury.  Given workup findings patient is reassured.    Patient is appropriate for discharge home.  Importance of close follow-up was stressed.  Strict return precaution given understood.  Amount and/or Complexity of Data Reviewed Radiology: ordered.  Risk OTC drugs. Prescription drug management.          Provider Communication  New Prescriptions   No medications on file    Modified Medications   No medications on file    Discontinued Medications   No medications on file    Clinical Impression Final diagnoses:  Fall, initial encounter    ED Disposition     ED Disposition  Discharge   Condition  Stable   Comment  --                   Electronically signed by:       [1] Social History Tobacco Use  Smoking Status Former   Current packs/day: 0.00   Average packs/day: 0.5 packs/day for 11.0 years (5.5 ttl pk-yrs)   Types: Cigarettes   Start date: 2007   Quit date: 08/12/2016   Years since quitting: 7.9   Passive exposure: Past  Smokeless Tobacco Never  [2] No Known Allergies  Maude Johnetta Galloway, MD 08/08/24 0423  "

## 2024-08-26 ENCOUNTER — Other Ambulatory Visit: Payer: Self-pay

## 2024-08-26 ENCOUNTER — Emergency Department (HOSPITAL_BASED_OUTPATIENT_CLINIC_OR_DEPARTMENT_OTHER)
Admission: EM | Admit: 2024-08-26 | Discharge: 2024-08-26 | Disposition: A | Discharge status: Home / Self Care | Payer: Worker's Compensation | Attending: Emergency Medicine | Admitting: Emergency Medicine

## 2024-08-26 ENCOUNTER — Encounter (HOSPITAL_BASED_OUTPATIENT_CLINIC_OR_DEPARTMENT_OTHER): Payer: Self-pay

## 2024-08-26 DIAGNOSIS — I10 Essential (primary) hypertension: Secondary | ICD-10-CM | POA: Insufficient documentation

## 2024-08-26 DIAGNOSIS — E119 Type 2 diabetes mellitus without complications: Secondary | ICD-10-CM | POA: Insufficient documentation

## 2024-08-26 DIAGNOSIS — S0990XD Unspecified injury of head, subsequent encounter: Secondary | ICD-10-CM | POA: Diagnosis present

## 2024-08-26 DIAGNOSIS — W19XXXD Unspecified fall, subsequent encounter: Secondary | ICD-10-CM | POA: Diagnosis not present

## 2024-08-26 DIAGNOSIS — Z79899 Other long term (current) drug therapy: Secondary | ICD-10-CM | POA: Insufficient documentation

## 2024-08-26 DIAGNOSIS — S069X0D Unspecified intracranial injury without loss of consciousness, subsequent encounter: Secondary | ICD-10-CM

## 2024-08-26 MED ORDER — NAPROXEN 500 MG PO TABS: 500.0000 mg | ORAL_TABLET | Freq: Two times a day (BID) | ORAL | 0 refills | Status: AC

## 2024-08-26 MED ORDER — LIDOCAINE 5 % EX PTCH: 1.0000 | MEDICATED_PATCH | CUTANEOUS | 0 refills | Status: AC

## 2024-08-26 NOTE — Discharge Instructions (Addendum)
 You were seen today for suspected concussion as cause for your headaches better recurrent.  Recommend you continue to follow-up with your PCP as well as with sports medicine for long-term monitoring and management of this.  I am sending in some anti-inflammatories for you to use instead of ibuprofen , do not take with ibuprofen .  As well as additionally you can take Tylenol  and I am sending in some lidocaine patches for additional relief to for muscular soreness.  Return to the ER for new or worsening symptoms.

## 2024-08-26 NOTE — ED Provider Notes (Signed)
 " [place] [facility] Provider Note   CSN: [id number] Arrival date & time: 08/26/24  1338     Patient presents with: Tristan Parker is a 63 y.o. male.   Fall Associated symptoms include headaches.  Patient is a 63 year old male presented the ED today for concerns for recurrent headache, intermittent accompanied with dizziness as well as persistent myalgias and arthralgias since fall 2 weeks ago.  States that he is currently asymptomatic at this time.  But came in due to recurrence of symptoms.  Currently taking Tylenol , ibuprofen , meclizine with improvement/resolution of symptoms.  But is concerned for persistence.  Previous medical history of HTN, GERD, HLD, type 2 diabetes.  Denies any numbness, weakness, tingling, blurry vision, dysphagia, dyne aphasia, unilateral weakness, decreased sensation, chest pain, shortness of breath, abdominal pain, nausea, vomiting, abnormal gait.     Prior to Admission medications  Medication Sig Start Date End Date Taking? Authorizing Provider  lidocaine (LIDODERM) 5 % Place 1 patch onto the skin daily. Remove & Discard patch within 12 hours or as directed by MD 08/26/24  Yes Beola, Journiee Feldkamp S, PA-C  naproxen  (NAPROSYN ) 500 MG tablet Take 1 tablet (500 mg total) by mouth 2 (two) times daily. 08/26/24  Yes Beola Terrall RAMAN, PA-C  famotidine  (PEPCID ) 20 MG tablet Take 1 tablet (20 mg total) by mouth 2 (two) times daily. 07/30/19   Christopher Savannah, PA-C  olmesartan (BENICAR) 40 MG tablet Take 40 mg by mouth daily.    [provider]  rosuvastatin  (CRESTOR ) 20 MG tablet Take 1 tablet (20 mg total) by mouth daily. 06/13/17   Joshua Debby CROME, MD  amLODipine  (NORVASC ) 5 MG tablet Take 1 tablet (5 mg total) by mouth daily. 06/13/17 07/30/19  Joshua Debby CROME, MD  lisinopril -hydrochlorothiazide  (PRINZIDE ,ZESTORETIC ) 20-25 MG tablet Take 1 tablet by mouth daily. 06/13/17 07/30/19  Joshua Debby CROME, MD    Allergies:  Patient has no known allergies.    Review of Systems  Neurological:  Positive for dizziness and headaches.  All other systems reviewed and are negative.   Updated Vital Signs BP (!) 141/98 (BP Location: Right Arm)   Pulse 77   Temp 97.7 F (36.5 C)   Resp 18   SpO2 100%   Physical Exam Vitals and nursing note reviewed.  Constitutional:      General: He is not in acute distress.    Appearance: Normal appearance. He is not ill-appearing or diaphoretic.  HENT:     Head: Normocephalic and atraumatic.     Mouth/Throat:     Mouth: Mucous membranes are moist.     Pharynx: Oropharynx is clear. No oropharyngeal exudate or posterior oropharyngeal erythema.  Eyes:     General: No scleral icterus.       Right eye: No discharge.        Left eye: No discharge.     Extraocular Movements: Extraocular movements intact.     Conjunctiva/sclera: Conjunctivae normal.     Pupils: Pupils are equal, round, and reactive to light.  Cardiovascular:     Rate and Rhythm: Normal rate and regular rhythm.     Pulses: Normal pulses.     Heart sounds: Normal heart sounds. No murmur heard.    No friction rub. No gallop.  Pulmonary:     Effort: Pulmonary effort is normal. No respiratory distress.     Breath sounds: No stridor. No wheezing, rhonchi or rales.  Chest:  Chest wall: No tenderness.  Abdominal:     General: Abdomen is flat. There is no distension.     Palpations: Abdomen is soft.     Tenderness: There is no abdominal tenderness. There is no right CVA tenderness, left CVA tenderness, guarding or rebound.  Musculoskeletal:        General: Tenderness (Patient notably tender over left trapezius, left shoulder, left knee.) present. No swelling, deformity or signs of injury.     Cervical back: Normal range of motion. Tenderness present. No rigidity.     Right lower leg: No edema.     Left lower leg: No edema.  Skin:    General: Skin is warm and dry.     Findings: No bruising, erythema or  lesion.  Neurological:     General: No focal deficit present.     Mental Status: He is alert and oriented to person, place, and time. Mental status is at baseline.     Sensory: No sensory deficit.     Motor: No weakness.     Comments: No facial asymmetry, no ataxia, no apraxia, no aphasia, no arm drift, normal coordination with finger-to-nose, normal sensation to both upper and lower extremities bilaterally, normal grip strength bilaterally, normal strength to both flexion and extension to both upper lower extremities 5+ bilaterally, no visual field deficits, no nystagmus.   Psychiatric:        Mood and Affect: Mood normal.     (all labs ordered are listed, but only abnormal results are displayed) Labs Reviewed - No data to display  EKG: None  Radiology: No results found. Procedures   Medications Ordered in the ED - No data to display    Medical Decision Making  This patient is a 63 year old male who presents to the ED for concern of intermittent, recurrent headache and dizziness after mechanical fall after stepping on oil 2 weeks ago.  Currently is not experiencing symptoms but came in today for concerns for recurrence and him wishing to return to work here soon, symptoms managed with meclizine, ibuprofen  and Tylenol  but is concerned due to patient's work resuming shortly.  On physical exam, patient is in no acute distress, afebrile, alert and orient x 4, speaking in full sentences, nontachypneic, nontachycardic.  LCTAB, RRR, no murmur, no abdominal tenderness to palpation, normal neuroexam.  Notable did have some tenderness over left trapezius, left knee and left shoulder.  Patient overall well-appearing, suspecting likely possible complex TBI/concussion with patient's persistent intermittent symptoms x 2 weeks.  Will have him continue to follow-up with his PCP as well as with sports medicine for long-term management.  Sending in naproxen  and lidocaine patch for additional relief in  the interim.  Low suspicion for any CVA, intracranial bleed, fracture, metabolic disturbance.  Patient vital signs have remained stable throughout the course of patient's time in the ED. Low suspicion for any other emergent pathology at this time. I believe this patient is safe to be discharged. Provided strict return to ER precautions. Patient expressed agreement and understanding of plan. All questions were answered.  Differential diagnoses prior to evaluation: The emergent differential diagnosis includes, but is not limited to, migraine headache, tension headache, cluster headache, CVA, malignancy, TBI, concussion, fracture, myalgia. This is not an exhaustive differential.   Past Medical History / Co-morbidities / Social History: HTN, GERD, HLD, type II diabetes  Additional history: Chart reviewed. Pertinent results include:   Notably seen on 08/08/2024 after fall, having had CT head, cervical and x-rays of  left shoulder, lumbar spine and chest, all of which were negative for any acute injury or abnormality.  Seen by PCP after the fall in 08/16/2024 for type 2 diabetes.  No complaints at that time.    Medications: I ordered medication including naproxen .  I have reviewed the patients home medicines and have made adjustments as needed.  Critical Interventions: None  Social Determinants of Health: Has good follow-up with PCP  Disposition: After consideration of the diagnostic results and the patients response to treatment, I feel that the patient would benefit from discharge and treatment as above.   emergency department workup does not suggest an emergent condition requiring admission or immediate intervention beyond what has been performed at this time. The plan is: Follow-up with PCP, sports medicine, symptomatic management at home, return for new or worsening symptoms. The patient is safe for discharge and has been instructed to return immediately for worsening symptoms, change in  symptoms or any other concerns.   Final diagnoses:  Traumatic brain injury, without loss of consciousness, subsequent encounter    ED Discharge Orders          Ordered    naproxen  (NAPROSYN ) 500 MG tablet  2 times daily        08/26/24 1620    lidocaine (LIDODERM) 5 %  Every 24 hours        08/26/24 1620               Beola Terrall RAMAN, [place] 08/26/24 1628  "

## 2024-08-26 NOTE — ED Triage Notes (Signed)
 Reports slipped on oil on 12/11. Hit back of head. L shoulder, lower back and L knee pain since.  Headache and dizziness since  Denies visual changes, N/V  No blood thinners, no LOC  Seen in ED on 12/11 for same symptoms

## 2024-08-31 ENCOUNTER — Emergency Department (HOSPITAL_BASED_OUTPATIENT_CLINIC_OR_DEPARTMENT_OTHER)
Admission: EM | Admit: 2024-08-31 | Discharge: 2024-08-31 | Disposition: A | Discharge status: Home / Self Care | Payer: Worker's Compensation | Attending: Emergency Medicine | Admitting: Emergency Medicine

## 2024-08-31 ENCOUNTER — Encounter (HOSPITAL_BASED_OUTPATIENT_CLINIC_OR_DEPARTMENT_OTHER): Payer: Self-pay | Admitting: Emergency Medicine

## 2024-08-31 ENCOUNTER — Emergency Department (HOSPITAL_BASED_OUTPATIENT_CLINIC_OR_DEPARTMENT_OTHER): Payer: Self-pay

## 2024-08-31 ENCOUNTER — Other Ambulatory Visit: Payer: Self-pay

## 2024-08-31 DIAGNOSIS — W19XXXA Unspecified fall, initial encounter: Secondary | ICD-10-CM | POA: Insufficient documentation

## 2024-08-31 DIAGNOSIS — Z5329 Procedure and treatment not carried out because of patient's decision for other reasons: Secondary | ICD-10-CM | POA: Diagnosis not present

## 2024-08-31 DIAGNOSIS — R519 Headache, unspecified: Secondary | ICD-10-CM | POA: Insufficient documentation

## 2024-08-31 DIAGNOSIS — Y990 Civilian activity done for income or pay: Secondary | ICD-10-CM | POA: Diagnosis not present

## 2024-08-31 NOTE — ED Triage Notes (Signed)
 Pt reports continued HA since fall at work on 12/11; sts he has received a referral to a specialist from [employer] comp doctor

## 2024-08-31 NOTE — ED Provider Notes (Signed)
 " [place] [facility] Provider Note   CSN: [id number] Arrival date & time: 08/31/24  1552     Patient presents with: Headache   Tristan Parker is a 64 y.o. male.   This is a 64 year old male who is here today for intermittent headaches over the last 1 month.  He fell at work.  He is currently awaiting an appointment to see a specialist for his Workmen's Comp.  He says the headache comes and goes, he is not having it right now but he was earlier.   Headache      Prior to Admission medications  Medication Sig Start Date End Date Taking? Authorizing Provider  famotidine  (PEPCID ) 20 MG tablet Take 1 tablet (20 mg total) by mouth 2 (two) times daily. 07/30/19   Christopher Savannah, PA-C  lidocaine  (LIDODERM ) 5 % Place 1 patch onto the skin daily. Remove & Discard patch within 12 hours or as directed by MD 08/26/24   Bauer, Collin S, PA-C  naproxen  (NAPROSYN ) 500 MG tablet Take 1 tablet (500 mg total) by mouth 2 (two) times daily. 08/26/24   Beola Terrall RAMAN, PA-C  olmesartan (BENICAR) 40 MG tablet Take 40 mg by mouth daily.    [provider]  rosuvastatin  (CRESTOR ) 20 MG tablet Take 1 tablet (20 mg total) by mouth daily. 06/13/17   Joshua Debby CROME, MD  amLODipine  (NORVASC ) 5 MG tablet Take 1 tablet (5 mg total) by mouth daily. 06/13/17 07/30/19  Joshua Debby CROME, MD  lisinopril -hydrochlorothiazide  (PRINZIDE ,ZESTORETIC ) 20-25 MG tablet Take 1 tablet by mouth daily. 06/13/17 07/30/19  Joshua Debby CROME, MD    Allergies: Patient has no known allergies.    Review of Systems  Neurological:  Positive for headaches.    Updated Vital Signs BP (!) 149/98 (BP Location: Right Arm)   Pulse 67   Temp 99 F (37.2 C)   Resp 18   Ht 5' 7 (1.702 m)   Wt 81.6 kg   SpO2 100%   BMI 28.19 kg/m   Physical Exam Vitals and nursing note reviewed.  HENT:     Head: Normocephalic and atraumatic.  Eyes:     Extraocular Movements: Extraocular movements intact.   Pulmonary:     Effort: Pulmonary effort is normal.  Abdominal:     Palpations: Abdomen is soft.  Musculoskeletal:        General: Normal range of motion.     Cervical back: Normal range of motion.  Neurological:     Mental Status: He is alert and oriented to person, place, and time.     Cranial Nerves: No cranial nerve deficit, dysarthria or facial asymmetry.     Motor: No weakness.     Coordination: Coordination normal.     Gait: Gait normal.     (all labs ordered are listed, but only abnormal results are displayed) Labs Reviewed - No data to display  EKG: None  Radiology: No results found.   Procedures   Medications Ordered in the ED - No data to display                                  Medical Decision Making 64 year old male here today for intermittent headache over the last 1 month.  Differential diagnoses include concussion, TBI, consider intracranial hemorrhage, consider skull fracture.  Plan-patient not having any symptoms now, however this is his third visit to the  ED for headache which has resolved by time he arrived in the ED.  I am scanning his head.  Reassessment 5:45 PM-patient eloped from the emergency room.  Amount and/or Complexity of Data Reviewed Radiology: ordered.        Final diagnoses:  None    ED Discharge Orders     None          Mannie Fairy DASEN, DO 08/31/24 1748  "

## 2024-09-29 ENCOUNTER — Encounter (HOSPITAL_BASED_OUTPATIENT_CLINIC_OR_DEPARTMENT_OTHER): Payer: Self-pay | Admitting: Emergency Medicine

## 2024-09-29 ENCOUNTER — Emergency Department (HOSPITAL_BASED_OUTPATIENT_CLINIC_OR_DEPARTMENT_OTHER): Payer: Worker's Compensation

## 2024-09-29 ENCOUNTER — Other Ambulatory Visit: Payer: Self-pay

## 2024-09-29 ENCOUNTER — Emergency Department (HOSPITAL_BASED_OUTPATIENT_CLINIC_OR_DEPARTMENT_OTHER)
Admission: EM | Admit: 2024-09-29 | Discharge: 2024-09-29 | Disposition: A | Discharge status: Other Acute Inpatient Hospital | Payer: Worker's Compensation | Attending: Emergency Medicine | Admitting: Emergency Medicine

## 2024-09-29 DIAGNOSIS — S060XAD Concussion with loss of consciousness status unknown, subsequent encounter: Secondary | ICD-10-CM | POA: Diagnosis not present

## 2024-09-29 DIAGNOSIS — I10 Essential (primary) hypertension: Secondary | ICD-10-CM | POA: Diagnosis not present

## 2024-09-29 DIAGNOSIS — G44309 Post-traumatic headache, unspecified, not intractable: Secondary | ICD-10-CM | POA: Diagnosis present

## 2024-09-29 DIAGNOSIS — W010XXD Fall on same level from slipping, tripping and stumbling without subsequent striking against object, subsequent encounter: Secondary | ICD-10-CM | POA: Diagnosis not present

## 2024-09-29 LAB — BASIC METABOLIC PANEL WITH GFR
Anion gap: 11 (ref 5–15)
BUN: 12 mg/dL (ref 8–23)
CO2: 26 mmol/L (ref 22–32)
Calcium: 8.8 mg/dL — ABNORMAL LOW (ref 8.9–10.3)
Chloride: 105 mmol/L (ref 98–111)
Creatinine, Ser: 1.15 mg/dL (ref 0.61–1.24)
GFR, Estimated: >60 mL/min
Glucose, Bld: 116 mg/dL — ABNORMAL HIGH (ref 70–99)
Potassium: 3.9 mmol/L (ref 3.5–5.1)
Sodium: 141 mmol/L (ref 135–145)

## 2024-09-29 LAB — CBC WITH DIFFERENTIAL/PLATELET
Abs Immature Granulocytes: 0 10*3/uL (ref 0.00–0.07)
Basophils Absolute: 0 10*3/uL (ref 0.0–0.1)
Basophils Relative: 1 %
Eosinophils Absolute: 0.3 10*3/uL (ref 0.0–0.5)
Eosinophils Relative: 5 %
HCT: 42 % (ref 39.0–52.0)
Hemoglobin: 14.3 g/dL (ref 13.0–17.0)
Immature Granulocytes: 0 %
Lymphocytes Relative: 41 %
Lymphs Abs: 2.1 10*3/uL (ref 0.7–4.0)
MCH: 30.4 pg (ref 26.0–34.0)
MCHC: 34 g/dL (ref 30.0–36.0)
MCV: 89.2 fL (ref 80.0–100.0)
Monocytes Absolute: 0.5 10*3/uL (ref 0.1–1.0)
Monocytes Relative: 10 %
Neutro Abs: 2.2 10*3/uL (ref 1.7–7.7)
Neutrophils Relative %: 43 %
Platelets: 161 10*3/uL (ref 150–400)
RBC: 4.71 MIL/uL (ref 4.22–5.81)
RDW: 13.2 % (ref 11.5–15.5)
WBC: 5.1 10*3/uL (ref 4.0–10.5)
nRBC: 0 % (ref 0.0–0.2)

## 2024-09-29 MED ORDER — ACETAMINOPHEN 500 MG PO TABS
1000.0000 mg | ORAL_TABLET | Freq: Once | ORAL | Status: AC
  Administered 2024-09-29: 1000 mg via ORAL
  Filled 2024-09-29: qty 2

## 2024-09-29 MED ORDER — KETOROLAC TROMETHAMINE 30 MG/ML IJ SOLN
30.0000 mg | Freq: Once | INTRAMUSCULAR | Status: AC
  Administered 2024-09-29: 30 mg via INTRAVENOUS
  Filled 2024-09-29: qty 1

## 2024-09-29 MED ORDER — IOHEXOL 350 MG/ML SOLN
75.0000 mL | Freq: Once | INTRAVENOUS | Status: AC | PRN
  Administered 2024-09-29: 75 mL via INTRAVENOUS

## 2024-09-29 NOTE — ED Triage Notes (Signed)
 Ongoing h/a/s since falling at work . Eval last month here for same

## 2024-09-29 NOTE — ED Provider Notes (Signed)
 " [place] [facility] Provider Note   CSN: [id number] Arrival date & time: 09/29/24  1105     Patient presents with: Headache   Tristan Parker is a 64 y.o. male.   HPI      Had a slip and fall on oil in the driveway at work in December 11 This morning took ibuprofen  and it is still there Episodes of headache since then, not every day but when it comes it stays This week it started yesterday AM and lasted hours. This morning woke up with it and it is still there When take the medicine was prescribed last time makes him sleepy but then wakes up with headache again  At least 2-3 times a week has a headache Nothing makes it better.  Goes away gradually after hours. Comes on randomly.  Sometimes will have lightheadedness and little bit/sort of blurred vision but more that head is hurting Taking meclizine for dizziness, also having episodes of this but different times as the headache.    Has been having dizziness also since the slip and fall Denies numbness, weakness, difficulty talking or walking, visual changes (no double vision or missing pieces of vision) or facial droop.    Past Medical History:  Diagnosis Date   GERD (gastroesophageal reflux disease)    Hyperlipemia    Hypertension     Prior to Admission medications  Medication Sig Start Date End Date Taking? Authorizing Provider  famotidine  (PEPCID ) 20 MG tablet Take 1 tablet (20 mg total) by mouth 2 (two) times daily. 07/30/19   Christopher Savannah, PA-C  lidocaine  (LIDODERM ) 5 % Place 1 patch onto the skin daily. Remove & Discard patch within 12 hours or as directed by MD 08/26/24   Beola Terrall RAMAN, PA-C  naproxen  (NAPROSYN ) 500 MG tablet Take 1 tablet (500 mg total) by mouth 2 (two) times daily. 08/26/24   Beola Terrall RAMAN, PA-C  olmesartan (BENICAR) 40 MG tablet Take 40 mg by mouth daily.    [provider]  rosuvastatin  (CRESTOR ) 20 MG tablet Take 1 tablet (20 mg total) by mouth  daily. 06/13/17   Joshua Debby CROME, MD  amLODipine  (NORVASC ) 5 MG tablet Take 1 tablet (5 mg total) by mouth daily. 06/13/17 07/30/19  Joshua Debby CROME, MD  lisinopril -hydrochlorothiazide  (PRINZIDE ,ZESTORETIC ) 20-25 MG tablet Take 1 tablet by mouth daily. 06/13/17 07/30/19  Joshua Debby CROME, MD    Allergies: Patient has no known allergies.    Review of Systems  Updated Vital Signs BP (!) 122/90 (BP Location: Right Arm)   Pulse (!) 58   Temp 98.8 F (37.1 C) (Oral)   Resp 16   SpO2 100%   Physical Exam Vitals and nursing note reviewed.  Constitutional:      General: He is not in acute distress.    Appearance: Normal appearance. He is well-developed. He is not ill-appearing or diaphoretic.  HENT:     Head: Normocephalic and atraumatic.  Eyes:     General: No visual field deficit.    Extraocular Movements: Extraocular movements intact.     Conjunctiva/sclera: Conjunctivae normal.     Pupils: Pupils are equal, round, and reactive to light.  Cardiovascular:     Rate and Rhythm: Normal rate and regular rhythm.     Pulses: Normal pulses.     Heart sounds: Normal heart sounds. No murmur heard.    No friction rub. No gallop.  Pulmonary:     Effort: Pulmonary effort is  normal. No respiratory distress.  Musculoskeletal:     Cervical back: Normal range of motion.  Skin:    General: Skin is warm and dry.     Findings: No erythema or rash.  Neurological:     General: No focal deficit present.     Mental Status: He is alert and oriented to person, place, and time.     GCS: GCS eye subscore is 4. GCS verbal subscore is 5. GCS motor subscore is 6.     Cranial Nerves: No cranial nerve deficit, dysarthria or facial asymmetry.     Sensory: No sensory deficit.     Motor: No weakness or tremor.     Coordination: Coordination normal. Finger-Nose-Finger Test normal.     Gait: Gait normal.     (all labs ordered are listed, but only abnormal results are displayed) Labs Reviewed  BASIC  METABOLIC PANEL WITH GFR - Abnormal; Notable for the following components:      Result Value   Glucose, Bld 116 (*)    Calcium  8.8 (*)    All other components within normal limits  CBC WITH DIFFERENTIAL/PLATELET    EKG: None  Radiology: CT VENOGRAM HEAD Result Date: 09/29/2024 EXAM: CT VENOGRAM WITH CONTRAST 09/29/2024 01:25:00 PM TECHNIQUE: CT venogram of the head/brain was performed with the administration of 75 mL of iohexol  (OMNIPAQUE ) 350 MG/ML injection. Multiplanar reformatted images are provided for review. MIP images are provided for review. Automated exposure control, iterative reconstruction, and/or weight based adjustment of the mA/kV was utilized to reduce the radiation dose to as low as reasonably achievable. COMPARISON: None available. CLINICAL HISTORY: Headache, intracranial hypertension features. FINDINGS: BRAIN/VENTRICLES: No acute intracranial hemorrhage. No extra axial fluid collection. Gray-white differentiation is maintained. No mass effect or midline shift. No hydrocephalus. ORBITS: No acute abnormality. SINUSES AND MASTOIDS: No acute abnormality. SOFT TISSUES AND SKULL: No acute abnormality. CT VENOGRAM: Dural sinuses opacify normally. The left transverse sinus is dominant. No dural venous sinus thrombosis. No significant stenosis. IMPRESSION: 1. No dural venous sinus thrombosis. 2. No acute intracranial abnormality. Electronically signed by: Lonni Necessary MD 09/29/2024 01:57 PM EST RP Workstation: [id number]   CT ANGIO HEAD NECK W WO CM Result Date: 09/29/2024 EXAM: CTA Head and Neck with Intravenous Contrast. CT Head with and without Contrast. CLINICAL HISTORY: Vertigo, central. Central vertigo. TECHNIQUE: Axial CTA images of the head and neck performed with intravenous contrast. MIP reconstructed images were created and reviewed. Axial computed tomography images of the head/brain performed with and without intravenous contrast. Note: Per PQRS, the description of  internal carotid artery percent stenosis, including 0 percent or normal exam, is based on [employer] American Symptomatic Carotid Endarterectomy Trial (NASCET) criteria. Dose reduction technique was used including one or more of the following: automated exposure control, adjustment of mA and kV according to patient size, and/or iterative reconstruction. CONTRAST: With and without; 75 mL iohexol  (OMNIPAQUE ) 350 MG/ML injection. COMPARISON: 03/04/2021. FINDINGS: CT HEAD: BRAIN: Mild white matter changes are again noted bilaterally. No acute intraparenchymal hemorrhage. No mass lesion. No CT evidence for acute territorial infarct. No midline shift or extra-axial collection. VENTRICLES: No hydrocephalus. ORBITS: The orbits are unremarkable. SINUSES AND MASTOIDS: The paranasal sinuses and mastoid air cells are clear. CTA NECK: COMMON CAROTID ARTERIES: No significant stenosis. No dissection or occlusion. INTERNAL CAROTID ARTERIES: No stenosis by NASCET criteria. No dissection or occlusion. VERTEBRAL ARTERIES: No significant stenosis. No dissection or occlusion. CTA HEAD: ANTERIOR CEREBRAL ARTERIES: No significant stenosis. No occlusion. No aneurysm. MIDDLE CEREBRAL  ARTERIES: No significant stenosis. No occlusion. No aneurysm. POSTERIOR CEREBRAL ARTERIES: No significant stenosis. No occlusion. Port  No aneurysm. BASILAR ARTERY: No significant stenosis. No occlusion. No aneurysm. OTHER: Atherosclerotic calcifications are present within the cavernous internal carotid arteries bilaterally without significant stenosis. SOFT TISSUES: No acute finding. No masses or lymphadenopathy. BONES: No acute osseous abnormality. IMPRESSION: 1. No acute intracranial hemorrhage or ischemic change. 2. Periventricular white matter changes are similar to the prior exam, mildly advanced for age. This most likely reflects the sequelae of chronic microvascular ischemia. 3. No significant stenosis, aneurysmal dilatation, or dissection involving the  arteries of the head and neck. Electronically signed by: Lonni Necessary MD 09/29/2024 01:55 PM EST RP Workstation: [id number]     Procedures   Medications Ordered in the ED  acetaminophen  (TYLENOL ) tablet 1,000 mg (1,000 mg Oral Given 09/29/24 1224)  iohexol  (OMNIPAQUE ) 350 MG/ML injection 75 mL (75 mLs Intravenous Contrast Given 09/29/24 1308)  ketorolac  (TORADOL ) 30 MG/ML injection 30 mg (30 mg Intravenous Given 09/29/24 1449)                                     64 year old male with history of hypertension and hyperlipidemia who had a slip and fall at work on December 11, presents with concern for persisting headache and dizziness.  Differential diagnosis includes delayed presentation of intracranial hemorrhage, CVT, vertebral dissection related to the following trauma with stroke, concussion.  Labs completed and evaluated by me in the setting of dizziness and desire for CT show no clinically significant electrolyte abnormalities or anemia.  Given persisting headache, multiple subsequent ED visits despite initially normal negative head CT in December as well as dizziness, will obtain CTA to evaluate for signs of dissection, as well as CT venogram to look for dural venous thrombosis.  CT completed showing no acute abnormalities, no sign of ICH, aneurysm, occlusion, dissection, no sign of CVT.  Suspect symptoms secondary to concussion.  Recommend PCP follow up. Patient discharged in stable condition with understanding of reasons to return.       Final diagnoses:  Post-traumatic headache, not intractable, unspecified chronicity pattern  Concussion with unknown loss of consciousness status, subsequent encounter    ED Discharge Orders     None          Dreama Longs, MD 09/29/24 2131  "
